# Patient Record
Sex: Female | Born: 1980 | Race: Black or African American | Hispanic: No | Marital: Married | Smoking: Never smoker
Health system: Southern US, Community
[De-identification: ages and names within clinical notes are randomized; demographics above are authoritative.]

## PROBLEM LIST (undated history)

## (undated) ENCOUNTER — Inpatient Hospital Stay (HOSPITAL_COMMUNITY): Payer: Self-pay

## (undated) DIAGNOSIS — IMO0001 Reserved for inherently not codable concepts without codable children: Secondary | ICD-10-CM

## (undated) DIAGNOSIS — Z789 Other specified health status: Secondary | ICD-10-CM

## (undated) HISTORY — PX: NO PAST SURGERIES: SHX2092

## (undated) HISTORY — DX: Gilbert syndrome: E80.4

## (undated) HISTORY — PX: MOUTH SURGERY: SHX715

## (undated) HISTORY — PX: DILATION AND EVACUATION: SHX1459

---
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
## 2000-11-19 ENCOUNTER — Other Ambulatory Visit: Admission: RE | Admit: 2000-11-19 | Discharge: 2000-11-19 | Payer: Self-pay | Admitting: *Deleted

## 2001-04-20 ENCOUNTER — Emergency Department (HOSPITAL_COMMUNITY): Admission: EM | Admit: 2001-04-20 | Discharge: 2001-04-20 | Payer: Self-pay | Admitting: Emergency Medicine

## 2001-04-20 ENCOUNTER — Encounter: Payer: Self-pay | Admitting: Emergency Medicine

## 2001-05-19 ENCOUNTER — Other Ambulatory Visit: Admission: RE | Admit: 2001-05-19 | Discharge: 2001-05-19 | Payer: Self-pay | Admitting: *Deleted

## 2001-10-12 ENCOUNTER — Other Ambulatory Visit: Admission: RE | Admit: 2001-10-12 | Discharge: 2001-10-12 | Payer: Self-pay | Admitting: *Deleted

## 2001-11-22 ENCOUNTER — Emergency Department (HOSPITAL_COMMUNITY): Admission: EM | Admit: 2001-11-22 | Discharge: 2001-11-22 | Payer: Self-pay | Admitting: Emergency Medicine

## 2002-05-04 ENCOUNTER — Other Ambulatory Visit: Admission: RE | Admit: 2002-05-04 | Discharge: 2002-05-04 | Payer: Self-pay | Admitting: *Deleted

## 2003-04-20 ENCOUNTER — Other Ambulatory Visit: Admission: RE | Admit: 2003-04-20 | Discharge: 2003-04-20 | Payer: Self-pay | Admitting: Gynecology

## 2004-05-28 ENCOUNTER — Other Ambulatory Visit: Admission: RE | Admit: 2004-05-28 | Discharge: 2004-05-28 | Payer: Self-pay | Admitting: Gynecology

## 2004-11-10 ENCOUNTER — Emergency Department: Payer: Self-pay | Admitting: Emergency Medicine

## 2004-11-11 ENCOUNTER — Ambulatory Visit: Payer: Self-pay | Admitting: Emergency Medicine

## 2005-06-20 ENCOUNTER — Other Ambulatory Visit: Admission: RE | Admit: 2005-06-20 | Discharge: 2005-06-20 | Payer: Self-pay | Admitting: Gynecology

## 2006-08-07 ENCOUNTER — Other Ambulatory Visit: Admission: RE | Admit: 2006-08-07 | Discharge: 2006-08-07 | Payer: Self-pay | Admitting: Gynecology

## 2007-08-10 ENCOUNTER — Other Ambulatory Visit: Admission: RE | Admit: 2007-08-10 | Discharge: 2007-08-10 | Payer: Self-pay | Admitting: Gynecology

## 2008-02-28 ENCOUNTER — Emergency Department (HOSPITAL_COMMUNITY): Admission: EM | Admit: 2008-02-28 | Discharge: 2008-02-28 | Payer: Self-pay | Admitting: Emergency Medicine

## 2008-08-10 ENCOUNTER — Other Ambulatory Visit: Admission: RE | Admit: 2008-08-10 | Discharge: 2008-08-10 | Payer: Self-pay | Admitting: Gynecology

## 2008-08-10 ENCOUNTER — Ambulatory Visit: Payer: Self-pay | Admitting: Gynecology

## 2008-08-10 ENCOUNTER — Encounter: Payer: Self-pay | Admitting: Gynecology

## 2009-02-06 ENCOUNTER — Other Ambulatory Visit: Admission: RE | Admit: 2009-02-06 | Discharge: 2009-02-06 | Payer: Self-pay | Admitting: Gynecology

## 2009-02-06 ENCOUNTER — Encounter: Payer: Self-pay | Admitting: Gynecology

## 2009-02-06 ENCOUNTER — Ambulatory Visit: Payer: Self-pay | Admitting: Gynecology

## 2009-10-30 ENCOUNTER — Ambulatory Visit: Payer: Self-pay | Admitting: Gynecology

## 2009-10-30 ENCOUNTER — Other Ambulatory Visit: Admission: RE | Admit: 2009-10-30 | Discharge: 2009-10-30 | Payer: Self-pay | Admitting: Gynecology

## 2010-04-28 NOTE — L&D Delivery Note (Signed)
C/C/+2 at 1332 Pushing started 1332  Delivery Note At 1:45 PM a viable female was delivered via Vaginal, Spontaneous Delivery (Presentation: OA to ROT, water birth with CNM attending).  APGAR: 8, 9; weight 5-15.   Placenta status: Intact, Spontaneous.  Cord: 3 vessels with the following complications: None.  Cord pH: not done  Anesthesia: Local  Episiotomy: None Lacerations: Labial;Vaginal Suture Repair: chromic 4.0 Est. Blood Loss (mL): 300  Mom to postpartum.  Baby to mother's room.  Tymothy Cass 03/07/2011, 2:22 PM

## 2010-07-05 ENCOUNTER — Ambulatory Visit (INDEPENDENT_AMBULATORY_CARE_PROVIDER_SITE_OTHER): Payer: Federal, State, Local not specified - PPO | Admitting: Gynecology

## 2010-07-05 DIAGNOSIS — R1031 Right lower quadrant pain: Secondary | ICD-10-CM

## 2010-07-05 DIAGNOSIS — N949 Unspecified condition associated with female genital organs and menstrual cycle: Secondary | ICD-10-CM

## 2010-07-05 DIAGNOSIS — N644 Mastodynia: Secondary | ICD-10-CM

## 2010-07-08 ENCOUNTER — Other Ambulatory Visit: Payer: Federal, State, Local not specified - PPO

## 2010-07-08 ENCOUNTER — Ambulatory Visit (INDEPENDENT_AMBULATORY_CARE_PROVIDER_SITE_OTHER): Payer: Federal, State, Local not specified - PPO | Admitting: Gynecology

## 2010-07-08 DIAGNOSIS — N949 Unspecified condition associated with female genital organs and menstrual cycle: Secondary | ICD-10-CM

## 2010-07-08 DIAGNOSIS — R1031 Right lower quadrant pain: Secondary | ICD-10-CM

## 2010-07-09 ENCOUNTER — Other Ambulatory Visit: Payer: Self-pay | Admitting: Gynecology

## 2010-07-09 ENCOUNTER — Encounter (INDEPENDENT_AMBULATORY_CARE_PROVIDER_SITE_OTHER): Payer: Federal, State, Local not specified - PPO | Admitting: Gynecology

## 2010-07-09 ENCOUNTER — Other Ambulatory Visit (HOSPITAL_COMMUNITY)
Admission: RE | Admit: 2010-07-09 | Discharge: 2010-07-09 | Disposition: A | Discharge status: Home / Self Care | Payer: Federal, State, Local not specified - PPO | Source: Ambulatory Visit | Attending: Gynecology | Admitting: Gynecology

## 2010-07-09 DIAGNOSIS — Z01419 Encounter for gynecological examination (general) (routine) without abnormal findings: Secondary | ICD-10-CM

## 2010-07-09 DIAGNOSIS — B373 Candidiasis of vulva and vagina: Secondary | ICD-10-CM

## 2010-07-09 DIAGNOSIS — Z124 Encounter for screening for malignant neoplasm of cervix: Secondary | ICD-10-CM | POA: Insufficient documentation

## 2010-07-15 ENCOUNTER — Ambulatory Visit: Payer: Federal, State, Local not specified - PPO | Admitting: Gynecology

## 2010-08-19 LAB — ABO/RH

## 2010-08-19 LAB — RUBELLA ANTIBODY, IGM: Rubella: IMMUNE

## 2010-08-19 LAB — HEPATITIS B SURFACE ANTIGEN: Hepatitis B Surface Ag: NEGATIVE

## 2010-08-19 LAB — HIV ANTIBODY (ROUTINE TESTING W REFLEX): HIV: NONREACTIVE

## 2011-01-29 LAB — BASIC METABOLIC PANEL
BUN: 6
CO2: 27
Calcium: 9
Chloride: 107
Creatinine, Ser: 0.68
GFR calc Af Amer: >60
GFR calc non Af Amer: >60
Glucose, Bld: 96
Potassium: 4
Sodium: 139

## 2011-01-29 LAB — URINALYSIS, ROUTINE W REFLEX MICROSCOPIC
Glucose, UA: NEGATIVE
Hgb urine dipstick: NEGATIVE
pH: 7

## 2011-01-29 LAB — POCT PREGNANCY, URINE: Preg Test, Ur: NEGATIVE

## 2011-03-07 ENCOUNTER — Encounter (HOSPITAL_COMMUNITY): Payer: Self-pay | Admitting: *Deleted

## 2011-03-07 ENCOUNTER — Inpatient Hospital Stay (HOSPITAL_COMMUNITY)
Admission: RE | Admit: 2011-03-07 | Discharge: 2011-03-09 | DRG: 373 | Disposition: A | Discharge status: Home / Self Care | Payer: Federal, State, Local not specified - PPO | Source: Ambulatory Visit | Attending: Obstetrics and Gynecology | Admitting: Obstetrics and Gynecology

## 2011-03-07 DIAGNOSIS — Z23 Encounter for immunization: Secondary | ICD-10-CM

## 2011-03-07 DIAGNOSIS — IMO0001 Reserved for inherently not codable concepts without codable children: Secondary | ICD-10-CM

## 2011-03-07 HISTORY — DX: Reserved for inherently not codable concepts without codable children: IMO0001

## 2011-03-07 MED ORDER — IBUPROFEN 600 MG PO TABS
600.0000 mg | ORAL_TABLET | Freq: Four times a day (QID) | ORAL | Status: DC
  Administered 2011-03-07 – 2011-03-09 (×8): 600 mg via ORAL
  Filled 2011-03-07 (×8): qty 1

## 2011-03-07 MED ORDER — ACETAMINOPHEN 325 MG PO TABS: 650.0000 mg | ORAL_TABLET | ORAL | Status: DC | PRN

## 2011-03-07 MED ORDER — OXYTOCIN 10 UNIT/ML IJ SOLN
INTRAMUSCULAR | Status: AC
  Administered 2011-03-07: 10 [IU] via INTRAMUSCULAR
  Filled 2011-03-07: qty 2

## 2011-03-07 MED ORDER — BENZOCAINE-MENTHOL 20-0.5 % EX AERO
1.0000 "application " | INHALATION_SPRAY | CUTANEOUS | Status: DC | PRN
  Administered 2011-03-07: 1 via TOPICAL

## 2011-03-07 MED ORDER — COMPLETENATE 29-1 MG PO CHEW
1.0000 | CHEWABLE_TABLET | Freq: Every day | ORAL | Status: DC
  Administered 2011-03-08 – 2011-03-09 (×2): 1 via ORAL
  Filled 2011-03-07 (×3): qty 1

## 2011-03-07 MED ORDER — FLEET ENEMA 7-19 GM/118ML RE ENEM: 1.0000 | ENEMA | RECTAL | Status: DC | PRN

## 2011-03-07 MED ORDER — DIPHENHYDRAMINE HCL 25 MG PO CAPS: 25.0000 mg | ORAL_CAPSULE | Freq: Four times a day (QID) | ORAL | Status: DC | PRN

## 2011-03-07 MED ORDER — OXYCODONE-ACETAMINOPHEN 5-325 MG PO TABS: 2.0000 | ORAL_TABLET | ORAL | Status: DC | PRN

## 2011-03-07 MED ORDER — TETANUS-DIPHTH-ACELL PERTUSSIS 5-2.5-18.5 LF-MCG/0.5 IM SUSP: 0.5000 mL | Freq: Once | INTRAMUSCULAR | Status: DC

## 2011-03-07 MED ORDER — ONDANSETRON HCL 4 MG/2ML IJ SOLN: 4.0000 mg | Freq: Four times a day (QID) | INTRAMUSCULAR | Status: DC | PRN

## 2011-03-07 MED ORDER — ONDANSETRON HCL 4 MG/2ML IJ SOLN: 4.0000 mg | INTRAMUSCULAR | Status: DC | PRN

## 2011-03-07 MED ORDER — LANOLIN HYDROUS EX OINT: TOPICAL_OINTMENT | CUTANEOUS | Status: DC | PRN

## 2011-03-07 MED ORDER — LIDOCAINE HCL (PF) 1 % IJ SOLN
30.0000 mL | INTRAMUSCULAR | Status: DC | PRN
  Filled 2011-03-07: qty 30

## 2011-03-07 MED ORDER — DIBUCAINE 1 % RE OINT: 1.0000 "application " | TOPICAL_OINTMENT | RECTAL | Status: DC | PRN

## 2011-03-07 MED ORDER — ZOLPIDEM TARTRATE 5 MG PO TABS: 5.0000 mg | ORAL_TABLET | Freq: Every evening | ORAL | Status: DC | PRN

## 2011-03-07 MED ORDER — SENNOSIDES-DOCUSATE SODIUM 8.6-50 MG PO TABS
2.0000 | ORAL_TABLET | Freq: Every day | ORAL | Status: DC
  Administered 2011-03-09: 2 via ORAL

## 2011-03-07 MED ORDER — BISACODYL 10 MG RE SUPP: 10.0000 mg | Freq: Every day | RECTAL | Status: DC | PRN

## 2011-03-07 MED ORDER — CITRIC ACID-SODIUM CITRATE 334-500 MG/5ML PO SOLN: 30.0000 mL | ORAL | Status: DC | PRN

## 2011-03-07 MED ORDER — PRENATAL PLUS 27-1 MG PO TABS
1.0000 | ORAL_TABLET | Freq: Every day | ORAL | Status: DC
  Filled 2011-03-07: qty 1

## 2011-03-07 MED ORDER — WITCH HAZEL-GLYCERIN EX PADS: 1.0000 "application " | MEDICATED_PAD | CUTANEOUS | Status: DC | PRN

## 2011-03-07 MED ORDER — IBUPROFEN 600 MG PO TABS: 600.0000 mg | ORAL_TABLET | Freq: Four times a day (QID) | ORAL | Status: DC | PRN

## 2011-03-07 MED ORDER — OXYCODONE-ACETAMINOPHEN 5-325 MG PO TABS: 1.0000 | ORAL_TABLET | ORAL | Status: DC | PRN

## 2011-03-07 MED ORDER — BENZOCAINE-MENTHOL 20-0.5 % EX AERO
INHALATION_SPRAY | CUTANEOUS | Status: AC
  Administered 2011-03-07: 1 via TOPICAL
  Filled 2011-03-07: qty 56

## 2011-03-07 MED ORDER — ONDANSETRON HCL 4 MG PO TABS: 4.0000 mg | ORAL_TABLET | ORAL | Status: DC | PRN

## 2011-03-07 MED ORDER — SIMETHICONE 80 MG PO CHEW: 80.0000 mg | CHEWABLE_TABLET | ORAL | Status: DC | PRN

## 2011-03-07 NOTE — H&P (Signed)
Nina Rodriguez is a 30 y.o. G1P0 at [redacted]w[redacted]d presenting for active labor. Seen in office this am,  was 5/c/-1 and BBOW. Parallel care w/ CNM/OB for water birth. Now laboring in water tub w/ support person at side. Increased rectal pressure and back labor since AROM w/ clear AF @ 1230.  Prenatal Course Source of Care:WOB  with onset of care at 9 weeks Pregnancy complications or risk: nl antepartum, Hgb C trait positive, FOB tested and neg. Current medications: PNV, Tums, Tylenol PM  OB History    Grav Para Term Preterm Abortions TAB SAB Ect Mult Living   1              Past Medical History  Diagnosis Date  . Gilbert syndrome   . Active labor (plan water labor/birth) 03/07/2011   Past Surgical History  Procedure Date  . Mouth surgery     age 25   Family History: family history includes Diabetes in her paternal grandfather, paternal grandmother, paternal uncle, and sister and Hypertension in her father, maternal grandfather, maternal grandmother, mother, paternal grandfather, paternal grandmother, and sister. Social History:  has an unknown smoking status. She does not have any smokeless tobacco history on file. She reports that she does not drink alcohol or use illicit drugs.  Review of Systems - Negative except as noted   Dilation: 7 Effacement (%): 90 Station: -1 Exam by:: paul Blood pressure 142/95, pulse 78, temperature 98.5 F (36.9 C), resp. rate 20, height 5\' 6"  (1.676 m), weight 99.791 kg (220 lb), last menstrual period 06/17/2010.  Physical Exam: Blood pressure 142/95, pulse 78, temperature 98.5 F (36.9 C), resp. rate 20, height 5\' 6"  (1.676 m), weight 99.791 kg (220 lb), last menstrual period 06/17/2010. General: NAD Heart: RRR, no murmurs Lungs: CTA b/l  Abd: Soft, NT, EFW 7lb  Ext: no edema Neuro: DTRs normal Other: no PEC s/s   Membranes: AROM w/ cl AF, vtx Vaginal bleeding: (+) show / small FHR:  Baseline rate 140   Variability  moderate  Accelerations  present  Decelerations early Contractions: Frequency 2-4  Duration 60 sec  Intensity mod - strong  Pertinent Labs/Studies:  CBC, RPR pending  Prenatal labs: ABO, Rh: A/Positive/-- (04/23 0000) Antibody:  neg Rubella:  immune RPR: Nonreactive (04/23 0000)  HBsAg: Negative (04/23 0000)  HIV: Non-reactive (04/23 0000)  GBS:   neg 1 hr Glucola nl  Genetic screening nl Ultrasound: Weeks 18 Result nl fetal anatomy  Assessment: 30 y.o. G1P0 at [redacted]w[redacted]d in spontaneous labor admitted to birthing suites  1. Labor: progressing well with back labor 2. Fetal Wellbeing: Category 1 on intermittent EFM  3. Pain Control: hydrotherapy, labor support 4. GBS: neg 5. Labile BP in labor  Plan:  1. Monitor progress closely 2. Labor support, Nubain IM PRN for pain control 3. Anticipate SVD - water birth 4. Monitor for [facility]   Consultant: Dr. Alesia Banda 03/07/2011, 1:01 PM

## 2011-03-07 NOTE — Progress Notes (Signed)
Asked to assess patient and update CNM on-call with status  S: Feeling well - pressure with ctx      Tolerating contractions well - breathing with ctx / calm / no urge to push        Laboring in tub with support person at tub-side   O:  VS: Blood pressure 145/103, pulse 77, temperature 98.1 F (36.7 C), resp. rate 20, height 5\' 6"  (1.676 m), weight 99.791 kg (220 lb).        Assisted out of tub - to bathroom to void / cervical exam        FHR : 140 intermittent check / CTX every 3-4 minutes             Cervix : 7-8 / 90% / vtx -1 / BBOW        Membranes: intact  A: Active labor  P: Hydrotherapy     Update to Arlan Organ CNM (on-call)     Labor support by midwife and spouse - CNM on-call available in next 20-30 minutes     Anticipate AROM at next cervical exam        Jermy Couper 03/07/2011, 11:40 AM

## 2011-03-07 NOTE — Progress Notes (Signed)
  S: Doing well, pain managed w/ hydrotherapy in tub, + back pain,   pelvic pressure and + urge to push   O: BP 136/84  Pulse 78  Temp 98.5 F (36.9 C)  Resp 20  Ht 5\' 6"  (1.676 m)  Wt 99.791 kg (220 lb)  BMI 35.51 kg/m2  LMP 06/17/2010   FHT:  FHR: 135 bpm UC:   irregular, every 2-4 minutes, coupling SVE:   Dilation: 9 Effacement (%): 80 Station: -1 Exam by:: paul cnm   A / P: Spontaneous labor, progressing normally  Fetal Wellbeing:  Category I Pain Control:  Labor support without medications  Anticipated MOD:  NSVD  PAUL,DANIELA 03/07/2011, 1:28 PM

## 2011-03-08 ENCOUNTER — Encounter (HOSPITAL_COMMUNITY): Payer: Self-pay

## 2011-03-08 LAB — CBC
Hemoglobin: 11.6 g/dL — ABNORMAL LOW (ref 12.0–15.0)
MCH: 30.4 pg (ref 26.0–34.0)
Platelets: 198 10*3/uL (ref 150–400)
RBC: 3.81 MIL/uL — ABNORMAL LOW (ref 3.87–5.11)
WBC: 13.6 10*3/uL — ABNORMAL HIGH (ref 4.0–10.5)

## 2011-03-08 LAB — RPR: RPR Ser Ql: NONREACTIVE

## 2011-03-08 NOTE — Progress Notes (Signed)
  PPD 1 SVD  S:  Reports feeling tired but well.             Tolerating po/ No nausea or vomiting             Bleeding is light             Pain controlled withprescription NSAID's including motrin             Up ad lib / ambulatory  Newborn  Information for the patient's newborn:  Nina Rodriguez, Nina Rodriguez [[id number]]  female  breast feeding, working on latch  / Circumcision planned, will do tomorrow - improve latch today   O:  A & O x 3 NAD             VS: Blood pressure 107/68, pulse 79, temperature 97.8 F (36.6 C), temperature source Oral, resp. rate 19, height 5\' 6"  (1.676 m), weight 99.791 kg (220 lb), last menstrual period 06/17/2010, unknown if currently breastfeeding.  LABS:  Basename 03/08/11 0541  HGB 11.6*  HCT 32.2*    I&O: I/O last 3 completed shifts: In: -  Out: 300 [Blood:300]      Lungs: Clear and unlabored  Heart: regular rate and rhythm / no mumurs  Abdomen: soft, non-tender, non-distended             Fundus: firm, non-tender, @U   Perineum:  Repair intact, minimal edema  Lochia: small  Extremities: trace edema, no calf pain or tenderness, neg Homans    A/P: PPD # 1 30 y.o., G1P1001 S/P: SVD water birth   Principal Problem:  *Postpartum care following vaginal delivery (water birth 11/9/)    Doing well - stable status  Routine post partum orders  Considering flu shot  Ojas Coone, CNM, MSN 03/08/2011, 8:59 AM

## 2011-03-09 MED ORDER — IBUPROFEN 600 MG PO TABS: 600.0000 mg | ORAL_TABLET | Freq: Four times a day (QID) | ORAL | Status: AC

## 2011-03-09 NOTE — Progress Notes (Signed)
Post Partum Day #2            Information for the patient's newborn:  Lyrical, Sowle Sheronica [[id number]]  female   / circumcision pending today Feeding: breast  Subjective: No HA, SOB, CP, F/C, breast symptoms. Pain minimal. Normal vaginal bleeding, no clots.      Objective:  Temp:  [98 F (36.7 C)-98.5 F (36.9 C)] 98 F (36.7 C) (11/10 2124) Pulse Rate:  [68-72] 72  (11/10 2124) Resp:  [18] 18  (11/10 2124) BP: (103-110)/(70-73) 103/70 mmHg (11/10 2124)  No intake or output data in the 24 hours ending 03/09/11 0827     Basename 03/08/11 0541  WBC 13.6*  HGB 11.6*  HCT 32.2*  PLT 198    Blood type: A/Positive/-- (04/23 0000) Rubella: Immune (04/23 0000)    Physical Exam:  General: alert, cooperative and no distress Uterine Fundus: firm Lochia: appropriate Perineum: repair intact, edema none DVT Evaluation: Negative Homan's sign. No significant calf/ankle edema.    Assessment/Plan: PPD # 2 / 30 y.o., G1P1001 S/P:spontaneous vaginal   Principal Problem:  *Postpartum care following vaginal delivery (water birth 11/9/)    normal postpartum exam  Continue current postpartum care  D/C home   LOS: 2 days   Eleftheria Taborn, CNM, MSN 03/09/2011, 8:27 AM

## 2011-03-09 NOTE — Discharge Summary (Signed)
Obstetric Discharge Summary Reason for Admission: onset of labor Prenatal Procedures: ultrasound Intrapartum Procedures: spontaneous vaginal delivery and water birth Postpartum Procedures: Tdap vaccine Complications-Operative and Postpartum: bilateral labial laceration Hemoglobin  Date Value Range Status  03/08/2011 11.6* 12.0-15.0 (g/dL) Final     HCT  Date Value Range Status  03/08/2011 32.2* 36.0-46.0 (%) Final    Discharge Diagnoses: Term Pregnancy-delivered  Discharge Information: Date: 03/09/2011 Activity: pelvic rest Diet: routine Medications: PNV and Ibuprofen Condition: stable Instructions: refer to practice specific booklet Discharge to: home Follow-up Information    Follow up with [name],[name], MD in 6 weeks.   Contact information:   [address] [place] [address] [phone]          Newborn Data: Live born female "Sheria Lang" Birth Weight: 5 lb 15.9 oz (2719 g) APGAR: 8, 9  Home with mother.  PAUL,DANIELA 03/09/2011, 8:32 AM

## 2011-04-29 ENCOUNTER — Encounter (HOSPITAL_COMMUNITY): Payer: Self-pay | Admitting: *Deleted

## 2011-04-29 ENCOUNTER — Emergency Department (HOSPITAL_COMMUNITY)
Admission: EM | Admit: 2011-04-29 | Discharge: 2011-04-29 | Disposition: A | Discharge status: Home / Self Care | Payer: Federal, State, Local not specified - PPO | Source: Home / Self Care | Attending: Emergency Medicine | Admitting: Emergency Medicine

## 2011-04-29 DIAGNOSIS — K529 Noninfective gastroenteritis and colitis, unspecified: Secondary | ICD-10-CM

## 2011-04-29 DIAGNOSIS — K5289 Other specified noninfective gastroenteritis and colitis: Secondary | ICD-10-CM

## 2011-04-29 HISTORY — DX: Reserved for inherently not codable concepts without codable children: IMO0001

## 2011-04-29 LAB — POCT URINALYSIS DIP (DEVICE)
Glucose, UA: NEGATIVE mg/dL
Leukocytes, UA: NEGATIVE
Nitrite: NEGATIVE
pH: 6 (ref 5.0–8.0)

## 2011-04-29 MED ORDER — BELLADONNA ALK-PHENOBARBITAL 16.2 MG/5ML PO ELIX: 10.0000 mL | ORAL_SOLUTION | Freq: Four times a day (QID) | ORAL | Status: DC | PRN

## 2011-04-29 MED ORDER — KETOROLAC TROMETHAMINE 60 MG/2ML IM SOLN
INTRAMUSCULAR | Status: AC
  Filled 2011-04-29: qty 2

## 2011-04-29 MED ORDER — TRAMADOL HCL 50 MG PO TABS: 100.0000 mg | ORAL_TABLET | Freq: Three times a day (TID) | ORAL | Status: AC | PRN

## 2011-04-29 MED ORDER — GI COCKTAIL ~~LOC~~
ORAL | Status: AC
  Filled 2011-04-29: qty 30

## 2011-04-29 MED ORDER — GI COCKTAIL ~~LOC~~
30.0000 mL | Freq: Once | ORAL | Status: AC
  Administered 2011-04-29: 30 mL via ORAL

## 2011-04-29 MED ORDER — KETOROLAC TROMETHAMINE 60 MG/2ML IM SOLN
60.0000 mg | Freq: Once | INTRAMUSCULAR | Status: AC
  Administered 2011-04-29: 60 mg via INTRAMUSCULAR

## 2011-04-29 NOTE — ED Provider Notes (Signed)
History     CSN: [id number]  Arrival date & time 04/29/11  4098   First MD Initiated Contact with Patient 04/29/11 0848      Chief Complaint  Patient presents with  . Headache  . Abdominal Pain  . Nausea    (Consider location/radiation/quality/duration/timing/severity/associated sxs/prior treatment) HPI Comments: Nina Rodriguez has had a history since yesterday of headache, chills, aching all over, aching in her chest and back, diffuse abdominal pain, and nausea. She denies any fever, sweats, nasal congestion, sore throat, cough, vomiting, or diarrhea. She has had no urinary symptoms or GYN complaints.  Patient is a 31 y.o. female presenting with headaches and abdominal pain.  Headache The primary symptoms include headaches and nausea. Primary symptoms do not include fever or vomiting.  Abdominal Pain The primary symptoms of the illness include abdominal pain, fatigue and nausea. The primary symptoms of the illness do not include fever, shortness of breath, vomiting, diarrhea or dysuria.  Additional symptoms associated with the illness include chills. Symptoms associated with the illness do not include constipation, urgency or frequency.    Past Medical History  Diagnosis Date  . Gilbert syndrome   . Active labor (plan water labor/birth) 03/07/2011  . Postpartum care following vaginal delivery (water birth 11/9/) 03/07/2011  . Normal breast feeding     Past Surgical History  Procedure Date  . Mouth surgery     age 35    Family History  Problem Relation Age of Onset  . Hypertension Mother   . Hypertension Father   . Diabetes Sister   . Hypertension Sister   . Diabetes Paternal Uncle   . Hypertension Maternal Grandmother   . Hypertension Maternal Grandfather   . Diabetes Paternal Grandmother   . Hypertension Paternal Grandmother   . Diabetes Paternal Grandfather   . Hypertension Paternal Grandfather     History  Substance Use Topics  . Smoking status: Never Smoker   .  Smokeless tobacco: Never Used  . Alcohol Use: No    OB History    Grav Para Term Preterm Abortions TAB SAB Ect Mult Living   1 1 1       1       Review of Systems  Constitutional: Positive for chills and fatigue. Negative for fever, appetite change and unexpected weight change.  Respiratory: Negative for cough, shortness of breath and wheezing.   Cardiovascular: Negative for chest pain.  Gastrointestinal: Positive for nausea and abdominal pain. Negative for vomiting, diarrhea, constipation, blood in stool, abdominal distention, anal bleeding and rectal pain.  Genitourinary: Negative for dysuria, urgency and frequency.  Musculoskeletal: Positive for myalgias.  Skin: Negative for rash.  Neurological: Positive for headaches.    Allergies  Review of patient's allergies indicates no known allergies.  Home Medications   Current Outpatient Rx  Name Route Sig Dispense Refill  . BELLADONNA ALK-PHENOBARBITAL 16.2 MG/5ML PO ELIX Oral Take 10 mLs (32.4 mg total) by mouth 4 (four) times daily as needed for cramping. 120 mL 0  . COMPLETENATE 29-1 MG PO CHEW Oral Chew 1 tablet by mouth daily.      . TRAMADOL HCL 50 MG PO TABS Oral Take 2 tablets (100 mg total) by mouth every 8 (eight) hours as needed for pain. Maximum dose= 8 tablets per day 30 tablet 0    BP 133/91  Pulse 82  Temp(Src) 99.8 F (37.7 C) (Oral)  Resp 16  SpO2 98%  LMP 04/17/2011  Breastfeeding? Yes  Physical Exam  Nursing note and  vitals reviewed. Constitutional: She appears well-developed and well-nourished. No distress.  Eyes: No scleral icterus.  Cardiovascular: Normal rate, regular rhythm and normal heart sounds.  Exam reveals no gallop and no friction rub.   No murmur heard. Pulmonary/Chest: Effort normal and breath sounds normal. No respiratory distress. She has no wheezes. She has no rales.  Abdominal: Soft. Bowel sounds are normal. She exhibits no distension and no mass. There is no hepatosplenomegaly. There  is tenderness (she has mild generalized tenderness to palpation without guarding or rebound). There is no rebound, no guarding and no CVA tenderness.  Skin: Skin is warm and dry. No rash noted. She is not diaphoretic.    ED Course  Procedures (including critical care time)  Results for orders placed during the hospital encounter of 04/29/11  POCT URINALYSIS DIP (DEVICE)      Component Value Range   Glucose, UA NEGATIVE  NEGATIVE (mg/dL)   Bilirubin Urine SMALL (*) NEGATIVE    Ketones, ur TRACE (*) NEGATIVE (mg/dL)   Specific Gravity, Urine 1.020  1.005 - 1.030    Hgb urine dipstick NEGATIVE  NEGATIVE    pH 6.0  5.0 - 8.0    Protein, ur NEGATIVE  NEGATIVE (mg/dL)   Urobilinogen, UA 1.0  0.0 - 1.0 (mg/dL)   Nitrite NEGATIVE  NEGATIVE    Leukocytes, UA NEGATIVE  NEGATIVE      Labs Reviewed  POCT URINALYSIS DIP (DEVICE) - Abnormal; Notable for the following:    Bilirubin Urine SMALL (*)    Ketones, ur TRACE (*)    All other components within normal limits   She was given call 60 mg IM and 30 and else a GI cocktail. She felt better after this and desired to go home. She is breast-feeding and knows to discard her breast milk today and also all subsequent breast milk when she's taking any medications.   No results found.   1. Gastroenteritis       MDM  She appears to have a viral syndrome, probably viral gastroenteritis. She was sent home with tramadol for the headache and Donnatal liquid for the abdominal pain. Suggested she return again if she's not better in 2 or 3 days or if he gets worse in any way.        Roque Lias, MD 04/29/11 [phone]

## 2011-04-29 NOTE — ED Notes (Signed)
Pt is here with complaints of 2 day history of severe HA, stomach pain with nausea and generalized body aches.  Denies cough or fever.  Pt is 7 weeks post partum and is breast feeding.

## 2012-04-28 NOTE — L&D Delivery Note (Signed)
Delivery Note  First Stage: Labor onset: 1430 Augmentation : AROM Analgesia /Anesthesia intrapartum: Nubain 10 mg IVP x 1 dose AROM at 1024  Second Stage: Complete dilation at 1736 Onset of pushing at 1736 FHR second stage 120  Delivery of a viable female at 75 by CNM in LOA position No nuchal cord Cord double clamped after cessation of pulsation, cut by sister Cord blood sample collected   Third Stage: Placenta delivered Nina Rodriguez intact with 3 VC @ 1749 Placenta disposition: L&D Uterine tone firm / bleeding minimal  No laceration identified  Repair: none Est. Blood Loss (mL): 100  Complications: none  Mom to postpartum.  Baby to Couplet care / Skin to Skin.  Newborn: Birth Weight: 7lbs 7.4oz  Apgar Scores: 8/9 Feeding planned: breast  *Dr. Juliene Pina notified of uncomplicated vaginal delivery and plans for early AM discharge - ok for discharge, if no complications and approval from ped  Raelyn Mora, M  MSN, CNM 03/31/2013, 5:58 PM

## 2012-08-27 LAB — OB RESULTS CONSOLE RPR: RPR: NONREACTIVE

## 2012-08-27 LAB — OB RESULTS CONSOLE HIV ANTIBODY (ROUTINE TESTING): HIV: NONREACTIVE

## 2013-02-02 ENCOUNTER — Encounter (HOSPITAL_COMMUNITY): Payer: Self-pay | Admitting: *Deleted

## 2013-02-02 ENCOUNTER — Inpatient Hospital Stay (HOSPITAL_COMMUNITY)
Admission: AD | Admit: 2013-02-02 | Discharge: 2013-02-02 | Disposition: A | Discharge status: Home / Self Care | Payer: No Typology Code available for payment source | Source: Ambulatory Visit | Attending: Obstetrics and Gynecology | Admitting: Obstetrics and Gynecology

## 2013-02-02 DIAGNOSIS — E860 Dehydration: Secondary | ICD-10-CM | POA: Insufficient documentation

## 2013-02-02 DIAGNOSIS — O4700 False labor before 37 completed weeks of gestation, unspecified trimester: Secondary | ICD-10-CM | POA: Insufficient documentation

## 2013-02-02 DIAGNOSIS — O479 False labor, unspecified: Secondary | ICD-10-CM

## 2013-02-02 LAB — URINALYSIS, ROUTINE W REFLEX MICROSCOPIC
Bilirubin Urine: NEGATIVE
Leukocytes, UA: NEGATIVE
Nitrite: NEGATIVE
Specific Gravity, Urine: 1.015 (ref 1.005–1.030)
Urobilinogen, UA: 1 mg/dL (ref 0.0–1.0)

## 2013-02-02 MED ORDER — NIFEDIPINE 10 MG PO CAPS
10.0000 mg | ORAL_CAPSULE | Freq: Once | ORAL | Status: AC
  Administered 2013-02-02: 10 mg via ORAL
  Filled 2013-02-02: qty 1

## 2013-02-02 MED ORDER — NIFEDIPINE 10 MG PO CAPS: 10.0000 mg | ORAL_CAPSULE | Freq: Four times a day (QID) | ORAL | Status: DC

## 2013-02-02 MED ORDER — DEXTROSE 5 % IN LACTATED RINGERS IV BOLUS
1000.0000 mL | Freq: Once | INTRAVENOUS | Status: AC
Start: 2013-02-02 — End: 2013-02-02
  Administered 2013-02-02: 1000 mL via INTRAVENOUS

## 2013-02-02 MED ORDER — NITROFURANTOIN MONOHYD MACRO 100 MG PO CAPS: 100.0000 mg | ORAL_CAPSULE | Freq: Two times a day (BID) | ORAL | Status: DC

## 2013-02-02 NOTE — MAU Provider Note (Signed)
History     Chief Complaint  Patient presents with  . Contractions  32 yo G3P1011 MBF @ [redacted] weeks gestation presents for further evaluation due to [facility] after presenting to the office and was found to have ctx q 3-4 mins on monitor. Cervix was long/closed   OB History   Grav Para Term Preterm Abortions TAB SAB Ect Mult Living   3 1 1  1  1   1       Past Medical History  Diagnosis Date  . Gilbert syndrome   . Active labor (plan water labor/birth) 03/07/2011  . Postpartum care following vaginal delivery (water birth 11/9/) 03/07/2011  . Normal breast feeding     Past Surgical History  Procedure Laterality Date  . Mouth surgery      age 73    Family History  Problem Relation Age of Onset  . Hypertension Mother   . Hypertension Father   . Diabetes Sister   . Hypertension Sister   . Diabetes Paternal Uncle   . Hypertension Maternal Grandmother   . Hypertension Maternal Grandfather   . Diabetes Paternal Grandmother   . Hypertension Paternal Grandmother   . Diabetes Paternal Grandfather   . Hypertension Paternal Grandfather     History  Substance Use Topics  . Smoking status: Never Smoker   . Smokeless tobacco: Never Used  . Alcohol Use: No    Allergies: No Known Allergies  Prescriptions prior to admission  Medication Sig Dispense Refill  . Prenatal Vit-Min-FA-Fish Oil ([employer] PRENATAL GUMMY PO) Take 2 tablets by mouth daily.         Physical Exam   Blood pressure 121/70, pulse 81, temperature 98.1 F (36.7 C), temperature source Oral, resp. rate 18, height 6' 6.5" (1.994 m), weight 92.262 kg (203 lb 6.4 oz), SpO2 100.00%, currently breastfeeding.  No exam performed today, done in office: cervix long/closed/-3.  Tracing: bseline 140  reactive ctx resolved ED Course  PMC 2nd to dehydration IUP @ 32 weeks  P) IVF( 1l) w/ resolution of sx. nifedipine 10 mg po x 1 given. ucx sent PTL prec.  nifedifine q 10 mg q 6 hr prn ctx. macrobid 100 mg po bid x 7days pending  ucx Keep sched OB appt MDM   Marni Franzoni A, MD 5:51 PM 02/02/2013

## 2013-02-02 NOTE — MAU Note (Signed)
Sent from office for eval of PTL.  On monitor at office, was contracting.

## 2013-02-03 ENCOUNTER — Telehealth (HOSPITAL_COMMUNITY): Payer: Self-pay | Admitting: *Deleted

## 2013-03-02 LAB — OB RESULTS CONSOLE GBS: GBS: NEGATIVE

## 2013-03-31 ENCOUNTER — Inpatient Hospital Stay (HOSPITAL_COMMUNITY): Payer: No Typology Code available for payment source | Admitting: Anesthesiology

## 2013-03-31 ENCOUNTER — Inpatient Hospital Stay (HOSPITAL_COMMUNITY)
Admission: AD | Admit: 2013-03-31 | Discharge: 2013-04-02 | DRG: 775 | Disposition: A | Discharge status: Home / Self Care | Payer: No Typology Code available for payment source | Source: Ambulatory Visit | Attending: Obstetrics and Gynecology | Admitting: Obstetrics and Gynecology

## 2013-03-31 ENCOUNTER — Encounter (HOSPITAL_COMMUNITY): Payer: No Typology Code available for payment source | Admitting: Anesthesiology

## 2013-03-31 ENCOUNTER — Encounter (HOSPITAL_COMMUNITY): Payer: Self-pay | Admitting: Obstetrics and Gynecology

## 2013-03-31 HISTORY — DX: Other specified health status: Z78.9

## 2013-03-31 LAB — CBC
MCH: 29.7 pg (ref 26.0–34.0)
MCHC: 36.1 g/dL — ABNORMAL HIGH (ref 30.0–36.0)
Platelets: 207 10*3/uL (ref 150–400)
RBC: 3.87 MIL/uL (ref 3.87–5.11)

## 2013-03-31 LAB — RPR: RPR Ser Ql: NONREACTIVE

## 2013-03-31 MED ORDER — EPHEDRINE 5 MG/ML INJ
10.0000 mg | INTRAVENOUS | Status: DC | PRN
  Filled 2013-03-31: qty 2
  Filled 2013-03-31: qty 4

## 2013-03-31 MED ORDER — PHENYLEPHRINE 40 MCG/ML (10ML) SYRINGE FOR IV PUSH (FOR BLOOD PRESSURE SUPPORT)
80.0000 ug | PREFILLED_SYRINGE | INTRAVENOUS | Status: DC | PRN
  Filled 2013-03-31: qty 10
  Filled 2013-03-31: qty 2

## 2013-03-31 MED ORDER — DIPHENHYDRAMINE HCL 50 MG/ML IJ SOLN: 12.5000 mg | INTRAMUSCULAR | Status: DC | PRN

## 2013-03-31 MED ORDER — BENZOCAINE-MENTHOL 20-0.5 % EX AERO: 1.0000 "application " | INHALATION_SPRAY | CUTANEOUS | Status: DC | PRN

## 2013-03-31 MED ORDER — CITRIC ACID-SODIUM CITRATE 334-500 MG/5ML PO SOLN: 30.0000 mL | ORAL | Status: DC | PRN

## 2013-03-31 MED ORDER — ONDANSETRON HCL 4 MG PO TABS: 4.0000 mg | ORAL_TABLET | ORAL | Status: DC | PRN

## 2013-03-31 MED ORDER — TETANUS-DIPHTH-ACELL PERTUSSIS 5-2.5-18.5 LF-MCG/0.5 IM SUSP: 0.5000 mL | Freq: Once | INTRAMUSCULAR | Status: DC

## 2013-03-31 MED ORDER — OXYTOCIN 10 UNIT/ML IJ SOLN
10.0000 [IU] | Freq: Once | INTRAMUSCULAR | Status: DC | PRN
  Filled 2013-03-31: qty 1

## 2013-03-31 MED ORDER — FENTANYL 2.5 MCG/ML BUPIVACAINE 1/10 % EPIDURAL INFUSION (WH - ANES)
14.0000 mL/h | INTRAMUSCULAR | Status: DC | PRN
  Filled 2013-03-31: qty 125

## 2013-03-31 MED ORDER — OXYTOCIN 40 UNITS IN LACTATED RINGERS INFUSION - SIMPLE MED
1.0000 m[IU]/min | INTRAVENOUS | Status: DC
  Filled 2013-03-31: qty 1000

## 2013-03-31 MED ORDER — WITCH HAZEL-GLYCERIN EX PADS: 1.0000 "application " | MEDICATED_PAD | CUTANEOUS | Status: DC | PRN

## 2013-03-31 MED ORDER — NALBUPHINE SYRINGE 5 MG/0.5 ML
INJECTION | INTRAMUSCULAR | Status: AC
  Administered 2013-03-31: 10 mg via INTRAVENOUS
  Filled 2013-03-31: qty 1

## 2013-03-31 MED ORDER — IBUPROFEN 600 MG PO TABS
600.0000 mg | ORAL_TABLET | Freq: Four times a day (QID) | ORAL | Status: DC
  Administered 2013-03-31 – 2013-04-02 (×6): 600 mg via ORAL
  Filled 2013-03-31 (×6): qty 1

## 2013-03-31 MED ORDER — PHENYLEPHRINE 40 MCG/ML (10ML) SYRINGE FOR IV PUSH (FOR BLOOD PRESSURE SUPPORT)
80.0000 ug | PREFILLED_SYRINGE | INTRAVENOUS | Status: DC | PRN
  Filled 2013-03-31: qty 2

## 2013-03-31 MED ORDER — PRENATAL MULTIVITAMIN CH
1.0000 | ORAL_TABLET | Freq: Every day | ORAL | Status: DC
  Administered 2013-04-01: 1 via ORAL
  Filled 2013-03-31: qty 1

## 2013-03-31 MED ORDER — SENNOSIDES-DOCUSATE SODIUM 8.6-50 MG PO TABS
2.0000 | ORAL_TABLET | ORAL | Status: DC
  Administered 2013-03-31 – 2013-04-02 (×2): 2 via ORAL
  Filled 2013-03-31 (×2): qty 2

## 2013-03-31 MED ORDER — LIDOCAINE HCL (PF) 1 % IJ SOLN
30.0000 mL | INTRAMUSCULAR | Status: DC | PRN
  Filled 2013-03-31: qty 30

## 2013-03-31 MED ORDER — ACETAMINOPHEN 325 MG PO TABS: 650.0000 mg | ORAL_TABLET | ORAL | Status: DC | PRN

## 2013-03-31 MED ORDER — IBUPROFEN 600 MG PO TABS: 600.0000 mg | ORAL_TABLET | Freq: Four times a day (QID) | ORAL | Status: DC | PRN

## 2013-03-31 MED ORDER — DIBUCAINE 1 % RE OINT: 1.0000 "application " | TOPICAL_OINTMENT | RECTAL | Status: DC | PRN

## 2013-03-31 MED ORDER — OXYCODONE-ACETAMINOPHEN 5-325 MG PO TABS: 1.0000 | ORAL_TABLET | ORAL | Status: DC | PRN

## 2013-03-31 MED ORDER — TERBUTALINE SULFATE 1 MG/ML IJ SOLN: 0.2500 mg | Freq: Once | INTRAMUSCULAR | Status: DC | PRN

## 2013-03-31 MED ORDER — SIMETHICONE 80 MG PO CHEW: 80.0000 mg | CHEWABLE_TABLET | ORAL | Status: DC | PRN

## 2013-03-31 MED ORDER — DIPHENHYDRAMINE HCL 25 MG PO CAPS: 25.0000 mg | ORAL_CAPSULE | Freq: Four times a day (QID) | ORAL | Status: DC | PRN

## 2013-03-31 MED ORDER — LACTATED RINGERS IV SOLN: 500.0000 mL | INTRAVENOUS | Status: DC | PRN

## 2013-03-31 MED ORDER — OXYTOCIN 40 UNITS IN LACTATED RINGERS INFUSION - SIMPLE MED
999.0000 mL/h | INTRAVENOUS | Status: DC
  Administered 2013-03-31: 999 mL/h via INTRAVENOUS

## 2013-03-31 MED ORDER — ONDANSETRON HCL 4 MG/2ML IJ SOLN: 4.0000 mg | INTRAMUSCULAR | Status: DC | PRN

## 2013-03-31 MED ORDER — EPHEDRINE 5 MG/ML INJ
10.0000 mg | INTRAVENOUS | Status: DC | PRN
  Filled 2013-03-31: qty 2

## 2013-03-31 MED ORDER — LACTATED RINGERS IV SOLN
500.0000 mL | Freq: Once | INTRAVENOUS | Status: AC
  Administered 2013-03-31: 500 mL via INTRAVENOUS

## 2013-03-31 MED ORDER — NALBUPHINE SYRINGE 5 MG/0.5 ML
10.0000 mg | INJECTION | Freq: Once | INTRAMUSCULAR | Status: AC
  Administered 2013-03-31: 10 mg via INTRAVENOUS

## 2013-03-31 MED ORDER — OXYTOCIN 10 UNIT/ML IJ SOLN: 40.0000 [IU] | Freq: Once | INTRAVENOUS | Status: DC

## 2013-03-31 NOTE — Progress Notes (Signed)
Patient ID: Nina Rodriguez, female   DOB: [birth year], 32 y.o.   MRN: [id number] S: Doing well.  Ambulating in hallway. Pain tolerable with breathing techniques. Pelvic pressure with contractions   O: Filed Vitals:   03/31/13 0943 03/31/13 1118 03/31/13 1159 03/31/13 1203  BP:  119/69  121/71  Pulse:  68 125 72  Temp:  98.2 F (36.8 C)    TempSrc:      Resp:  12  14  Height: 5\' 6"  (1.676 m)     Weight: 95.709 kg (211 lb)     SpO2:   88%      FHT:  FHR: 130 bpm, variability: moderate,  accelerations: 10x10 Present,  decelerations:  Present early; variables UC:   regular, every 2-3 minutes SVE:   Dilation: 5 Effacement (%): 90 Station: -1 Exam by:: Carloyn Jaeger, CNM   A / P: Spontaneous labor AROM-clear fluid  Fetal Wellbeing:  Category I Pain Control:  Labor support without medications-planning hydrotherapy  Anticipated MOD:  NSVD-planning waterbirth  Kenard Gower, MSN, CNM 03/31/2013, 1:26 PM

## 2013-03-31 NOTE — Progress Notes (Signed)
Discuss with patient and spouse plan of care: AROM, ambulation, submersion in tub when in active labor pattern, and continuous fetal monitoring while in tub.  *Dr. Billy Coast made aware of plan - agrees with plan  Kenard Gower., MSN, CNM 03/31/2013 10:10 AM

## 2013-03-31 NOTE — Progress Notes (Signed)
Patient ID: Nina Rodriguez, female   DOB: 12/23/1980, 32 y.o.   MRN: [id number] S: Having a hard time finding relief with hydrotherapy, pain minimally controlled with hydrotherapy.  Requesting epidural asap. (+) pelvic pressure with every contraction.   O: Filed Vitals:   03/31/13 1203 03/31/13 1412 03/31/13 1503 03/31/13 1614  BP: 121/71 120/70 108/59 118/69  Pulse: 72 83 89 92  Temp:  97.9 F (36.6 C)  98.6 F (37 C)  TempSrc:    Oral  Resp: 14 20 24 24   Height:      Weight:      SpO2:         FHT:  FHR: 135 bpm, variability: minimal ,  accelerations:  10 x 10 Present,  decelerations:  Absent UC:   regular, every 2-3 minutes by palpation- suboptimal tracing with wireless tocometer SVE:   Dilation: 6 Effacement (%): 90 Station: -1 Exam by:: Carloyn Jaeger, CNM   A / P: Spontaneous labor, slow progression  Fetal Wellbeing:  Category I Pain Control:  hydrotherapy Out of tub: 1620  Anticipated MOD:  NSVD  Kenard Gower, MSN, CNM 03/31/2013, 4:25 PM

## 2013-03-31 NOTE — Progress Notes (Addendum)
Patient ID: Nina Rodriguez, female   DOB: 03-Jan-1981, 32 y.o.   MRN: [id number]   Subjective: Nina Rodriguez is a 32 y.o. G3P1011 at [redacted]w[redacted]d by LMP admitted for active labor.  Objective: Filed Vitals:   03/31/13 0921 03/31/13 0943  BP: 126/65   Pulse: 81   Temp: 98 F (36.7 C)   TempSrc: Oral   Resp: 20   Height:  5\' 6"  (1.676 m)  Weight:  95.709 kg (211 lb)    FHT:  FHR: 125 bpm, variability: moderate,  accelerations:  Abscent,  decelerations:  Absent UC:   regular, every 5-7 minutes SVE:   Dilation: 5 Effacement (%): 90 Station: -1 Exam by:: Carloyn Jaeger, CNM AROM - moderate amount of clear fluid   Labs:   Recent Labs  03/31/13 0950  WBC 6.9  HGB 11.5*  HCT 31.9*  PLT 207    Assessment / Plan: Spontaneous labor, progressing normally AROM - clear fluid  Labor: Progressing normally Preeclampsia:  N/A Fetal Wellbeing:  Category II Pain Control:  Labor support without medications, planning hydrotherapy I/D:  n/a Anticipated MOD:  NSVD-planning waterbirth  Kenard Gower, MSN, CNM 03/31/2013, 10:33 AM

## 2013-03-31 NOTE — Anesthesia Preprocedure Evaluation (Deleted)
Anesthesia Evaluation  Patient identified by MRN, date of birth, ID band Patient awake    Reviewed: Allergy & Precautions, H&P , NPO status , Patient's Chart, lab work & pertinent test results  Airway Mallampati: II TM Distance: >3 FB Neck ROM: full    Dental no notable dental hx.    Pulmonary neg pulmonary ROS,    Pulmonary exam normal       Cardiovascular negative cardio ROS      Neuro/Psych negative neurological ROS  negative psych ROS   GI/Hepatic negative GI ROS, Neg liver ROS,   Endo/Other  negative endocrine ROS  Renal/GU negative Renal ROS  negative genitourinary   Musculoskeletal negative musculoskeletal ROS (+)   Abdominal Normal abdominal exam  (+)   Peds  Hematology negative hematology ROS (+)   Anesthesia Other Findings   Reproductive/Obstetrics (+) Pregnancy                           Anesthesia Physical Anesthesia Plan  ASA: II  Anesthesia Plan: Epidural   Post-op Pain Management:    Induction:   Airway Management Planned:   Additional Equipment:   Intra-op Plan:   Post-operative Plan:   Informed Consent: I have reviewed the patients History and Physical, chart, labs and discussed the procedure including the risks, benefits and alternatives for the proposed anesthesia with the patient or authorized representative who has indicated his/her understanding and acceptance.     Plan Discussed with:   Anesthesia Plan Comments:         Anesthesia Quick Evaluation  

## 2013-03-31 NOTE — Progress Notes (Signed)
Patient ID: Nina Rodriguez, female   DOB: 1980/12/01, 32 y.o.   MRN: [id number] S: Doing well.  Back in room from ambulating in hallway.  Pain tolerated with breathing techniques.  (+) pelvic pressure with contractions.  O: Filed Vitals:   03/31/13 1118 03/31/13 1159 03/31/13 1203 03/31/13 1412  BP: 119/69  121/71 120/70  Pulse: 68 125 72 83  Temp: 98.2 F (36.8 C)   97.9 F (36.6 C)  TempSrc:      Resp: 12  14 20   Height:      Weight:      SpO2:  88%       FHT:  FHR: 130 bpm, variability: moderate,  accelerations:  Present,  decelerations:  Absent UC:   regular, every 2-6 minutes SVE:   Dilation: 5 Effacement (%): 90 Station: -1 Exam by:: Nina Rodriguez, CNM   A / P: Spontaneous labor AROM-clear  Fetal Wellbeing:  Category I Pain Control:  Labor support without medications In tub: 1501; temp 100.3  Anticipated MOD:  NSVD-planning water birth  Nina Rodriguez, M, MSN, CNM 03/31/2013, 3:02 PM

## 2013-03-31 NOTE — H&P (Signed)
OB ADMISSION/ HISTORY & PHYSICAL:  Admission Date: 03/31/2013  8:51 AM  Admit Diagnosis: Labor  Nina Rodriguez is a 32 y.o. female presenting for advanced dilation and irregular contractions.  Prenatal History: G3P1011   EDC : 03/29/2013, by LMP Prenatal care at [facility] since 9.[redacted] weeks gestation Primary Care Provider at [facility]: Dr. Cherly Hensen  Prenatal course complicated by none  Prenatal Labs: ABO, Rh:  A Positive Antibody: Negative (05/02 0000) Rubella:  Immune RPR: Nonreactive (05/02 0000)  HBsAg:   HIV: Non-reactive (05/02 0000)  GBS: Negative (11/05 0000)  1 hr Glucola : 159 3 hr Glucola : 97, 103, 113, 83   Medical / Surgical History :  Past medical history:  Past Medical History  Diagnosis Date  . Gilbert syndrome   . Active labor (plan water labor/birth) 03/07/2011  . Postpartum care following vaginal delivery (water birth 11/9/) 03/07/2011  . Normal breast feeding   . Normal labor 03/31/2013  . Medical history non-contributory   . Normal labor 03/31/2013     Past surgical history:  Past Surgical History  Procedure Laterality Date  . Mouth surgery      age 20  . No past surgeries       Family History:  Family History  Problem Relation Age of Onset  . Hypertension Mother   . Hypertension Father   . Diabetes Sister   . Hypertension Sister   . Diabetes Paternal Uncle   . Hypertension Maternal Grandmother   . Hypertension Maternal Grandfather   . Diabetes Paternal Grandmother   . Hypertension Paternal Grandmother   . Diabetes Paternal Grandfather   . Hypertension Paternal Grandfather      Social History:  reports that she has never smoked. She has never used smokeless tobacco. She reports that she does not drink alcohol or use illicit drugs.   Allergies: Review of patient's allergies indicates no known allergies.    Current Medications at time of admission:  Prescriptions prior to admission  Medication Sig Dispense  Refill  . Prenatal Vit-Min-FA-Fish Oil ([facility] PRENATAL GUMMY PO) Take 2 tablets by mouth daily.          Review of Systems: Review of Systems  Constitutional: Negative.   HENT: Negative.   Eyes: Negative.   Respiratory: Negative.   Cardiovascular: Negative.   Gastrointestinal: Negative.   Genitourinary: Negative.   Musculoskeletal: Negative.   Skin: Negative.   Neurological: Negative.   Endo/Heme/Allergies: Negative.   Psychiatric/Behavioral: Negative.    Results for orders placed during the hospital encounter of 03/31/13 (from the past 24 hour(s))  CBC     Status: Abnormal   Collection Time    03/31/13  9:50 AM      Result Value Range   WBC 6.9  4.0 - 10.5 K/uL   RBC 3.87  3.87 - 5.11 MIL/uL   Hemoglobin 11.5 (*) 12.0 - 15.0 g/dL   HCT 16.1 (*) 09.6 - 04.5 %   MCV 82.4  78.0 - 100.0 fL   MCH 29.7  26.0 - 34.0 pg   MCHC 36.1 (*) 30.0 - 36.0 g/dL   RDW 40.9  81.1 - 91.4 %   Platelets 207  150 - 400 K/uL     Physical Exam:  Dilation: 5 Effacement (%): 90 Station: -1 Exam by:: Carloyn Jaeger, CNM Filed Vitals:   03/31/13 0921  BP: 126/65  Pulse: 81  Temp: 98 F (36.7 C)  Resp: 20   General: A&O x 3, NAD  Heart: RRR Lungs: CTAB Abdomen: gravid, soft, non-tender, normal bowel sounds Extremities: atraumatic, normal ROM, no edema, varicosities or signs of DVT Genitalia / VE: normal genitalia / 5/90/-1/vtx FHR: 125 reassuring TOCO: 5-7 mins, mild  Labs:     Recent Labs  03/31/13 0950  WBC 6.9  HGB 11.5*  HCT 31.9*  PLT 207     Assessment:  32 y.o. G3P1011 at [redacted]w[redacted]d  1. Labor: early 2. Fetal Wellbeing: Category 2  3. Pain Control: none - planning hydrotherapy 4. GBS: Negative  Plan:  1. Admit to BS 2. Routine L&D orders 3. AROM     Dr Billy Coast notified of admission / plan of care    Kenard Gower, MSN, CNM 03/31/2013, 10:53 AM

## 2013-04-01 LAB — CBC
HCT: 32.1 % — ABNORMAL LOW (ref 36.0–46.0)
MCH: 29.4 pg (ref 26.0–34.0)
MCHC: 35.8 g/dL (ref 30.0–36.0)
MCV: 82.1 fL (ref 78.0–100.0)
Platelets: 216 10*3/uL (ref 150–400)
RDW: 13.7 % (ref 11.5–15.5)

## 2013-04-01 MED ORDER — OXYCODONE-ACETAMINOPHEN 5-325 MG PO TABS: 1.0000 | ORAL_TABLET | ORAL | Status: DC | PRN

## 2013-04-01 MED ORDER — IBUPROFEN 600 MG PO TABS: 600.0000 mg | ORAL_TABLET | Freq: Four times a day (QID) | ORAL | Status: DC

## 2013-04-01 NOTE — Discharge Summary (Signed)
Obstetric Discharge Summary  Reason for Admission: onset of labor Prenatal Procedures: NST and ultrasound Intrapartum Procedures: augmentation with AROM / spontaneous vaginal delivery Postpartum Procedures: none Complications-Operative and Postpartum: none Hemoglobin  Date Value Range Status  04/01/2013 11.5* 12.0 - 15.0 g/dL Final     HCT  Date Value Range Status  04/01/2013 32.1* 36.0 - 46.0 % Final    Physical Exam:  General: alert, cooperative and no distress Lochia: appropriate Uterine Fundus: firm DVT Evaluation: No evidence of DVT seen on physical exam.  Discharge Diagnoses: Term Pregnancy-delivered  Discharge Information: Date: 04/01/2013 Activity: pelvic rest Diet: routine Medications: PNV, Ibuprofen and Percocet Condition: stable Instructions: refer to practice specific booklet Discharge to: home Follow-up Information   Follow up with Nina A, MD. Schedule an appointment as soon as possible for Rodriguez visit in 6 weeks.   Specialty:  Obstetrics and Gynecology   Contact information:   [address] [place] [address] [phone]       Newborn Data: Live born female  Birth Weight: 7 lb 7.4 oz (3385 g) APGAR: 8, 9  Home with mother.  Nina Rodriguez 04/01/2013, 10:14 AM

## 2013-04-01 NOTE — Lactation Note (Signed)
This note was copied from the chart of Boy Vanesha Zaugg. Lactation Consultation Note  Patient Name: Boy Danilyn Osmundson Today's Date: 04/01/2013 Reason for consult: Follow-up assessment;Difficult latch Mom called for assist with latching baby to right breast. She has not been able to get baby to sustain a latch on the right breast. He is nursing well on the left per Mom's reports. Parents report baby has been spitty his 1st 24 hours. Assisted Mom with latching to right breast in football hold. Demonstrated to Mom how to obtain good depth with latch. After few attempts, baby developed a good suckling pattern, with swallows noted. Baby nursed for about 10 minutes, then became sleepy. Advised Mom to ask for assist as needed.   Maternal Data Formula Feeding for Exclusion: No Infant to breast within first hour of birth: Yes Has patient been taught Hand Expression?: Yes Does the patient have breastfeeding experience prior to this delivery?: Yes  Feeding Feeding Type: Breast Fed Length of feed: 10 min  LATCH Score/Interventions Latch: Repeated attempts needed to sustain latch, nipple held in mouth throughout feeding, stimulation needed to elicit sucking reflex. (right breast) Intervention(s): Adjust position;Assist with latch;Breast massage;Breast compression  Audible Swallowing: A few with stimulation  Type of Nipple: Everted at rest and after stimulation  Comfort (Breast/Nipple): Soft / non-tender     Hold (Positioning): Assistance needed to correctly position infant at breast and maintain latch.  LATCH Score: 7  Lactation Tools Discussed/Used     Consult Status Consult Status: Follow-up Date: 04/02/13 Follow-up type: In-patient    Alfred Levins 04/01/2013, 4:28 PM

## 2013-04-01 NOTE — Progress Notes (Signed)
PT TEARY EYED OVER GRANDMOTHER DEATH AND FUNERAL TOMORROW. possible PLAN  IF INFANT NOT READY TO GO HOME IN AM TO MAKE INFANT BABY PT AND ALLOW  HUSBAND TO STAY WITH INFANT AND ALLOW MOTHER TO BE DISCHARGED AND GO TO FUNERAL . PT STATED SHE WOULD THINK ABOUT T. INFANT NOT VOIDED, NEEDS CIRCUMCISION AND ? BILI  ISSUES. RN  spoke with house coverage concerning issue and Nursery RN aware on nights . Decision to be made on day shift

## 2013-04-01 NOTE — Progress Notes (Signed)
POSTPARTUM DAY # 1 S/P SVB   S:         Reports feeling well - plans DC this PM             Tolerating po intake / no nausea / no vomiting             Bleeding is light             Pain controlled with motrin and percocet             Up ad lib / ambulatory/ voiding QS  Newborn breast feeding  / Circumcision planned today   O:  VS: BP 102/46  Pulse 70  Temp(Src) 97.2 F (36.2 C) (Oral)  Resp 20  Ht 5\' 6"  (1.676 m)  Wt 95.709 kg (211 lb)  BMI 34.07 kg/m2  SpO2 97%   LABS:               Recent Labs  03/31/13 0950 04/01/13 0550  WBC 6.9 16.7*  HGB 11.5* 11.5*  PLT 207 216               Bloodtype:  A positive  Rubella:         Immune                                 Physical Exam:             Alert and Oriented X3  Lungs: Clear and unlabored  Heart: regular rate and rhythm / no mumurs  Abdomen: soft, non-tender, non-distended              Fundus: firm, non-tender, U-1             Perineum: intact  Lochia: light  Extremities: no edema, no calf pain or tenderness, negative Homans  A:        PPD # 1 S/P SVB              P:        Routine postoperative care              DC home     Marlinda Mike CNM, MSN, [facility] 04/01/2013, 9:37 AM

## 2014-02-27 ENCOUNTER — Encounter (HOSPITAL_COMMUNITY): Payer: Self-pay | Admitting: Obstetrics and Gynecology

## 2020-04-28 NOTE — L&D Delivery Note (Signed)
Delivery Note   Kennis, Buell Haliyah [[id number]]  At 7:49 PM a viable and healthy female was delivered via Vaginal, Spontaneous (Presentation: vtx; LOA ).  APGAR: 8, 9; weight pending .   Placenta status: spontaneous, intact sent to path , .  Cord:  with the following complications: none( clamp on cord A).  Anesthesia:  epidural Episiotomy: None Lacerations: None Suture Repair:  n/a Est. Blood Loss (mL):  200    Shadow, Stiggers Aundrea [[id number]]  At 8:00 PM a viable and healthy female was delivered via Vaginal, Spontaneous (Presentation: vtx; LOA ).  APGAR: 7, 8; weight  pending.   Placenta status: spontaneous intact,( twin) to path .  Cord:  with the following complications: .  Anesthesia:  epidural Episiotomy: None Lacerations: None Suture Repair:  n/a Est. Blood Loss (mL):  200   Mom to postpartum.   Baby A to Couplet care / Skin to Skin.   Baby B to Couplet care / Skin to Skin.  Jariyah Hackley A Kalan Yeley 12/04/2020, 8:18 PM

## 2020-06-07 LAB — HEPATITIS C ANTIBODY: HCV Ab: NEGATIVE

## 2020-06-07 LAB — OB RESULTS CONSOLE HIV ANTIBODY (ROUTINE TESTING): HIV: NONREACTIVE

## 2020-06-07 LAB — OB RESULTS CONSOLE HEPATITIS B SURFACE ANTIGEN: Hepatitis B Surface Ag: NEGATIVE

## 2020-07-11 ENCOUNTER — Telehealth: Payer: Self-pay

## 2020-07-11 NOTE — Telephone Encounter (Signed)
Attempted to call patient to schedule for Detail, Anatomy ultrasound.  Not available at this time and mailbox is full.  Will call again

## 2020-07-12 ENCOUNTER — Other Ambulatory Visit: Payer: Self-pay | Admitting: Obstetrics and Gynecology

## 2020-07-12 DIAGNOSIS — O09522 Supervision of elderly multigravida, second trimester: Secondary | ICD-10-CM

## 2020-08-09 ENCOUNTER — Other Ambulatory Visit: Payer: No Typology Code available for payment source

## 2020-08-09 ENCOUNTER — Ambulatory Visit: Payer: No Typology Code available for payment source

## 2020-08-22 ENCOUNTER — Other Ambulatory Visit: Payer: Self-pay | Admitting: Obstetrics and Gynecology

## 2020-08-22 ENCOUNTER — Ambulatory Visit: Payer: No Typology Code available for payment source | Attending: Obstetrics and Gynecology

## 2020-08-22 ENCOUNTER — Other Ambulatory Visit: Payer: Self-pay

## 2020-08-22 ENCOUNTER — Ambulatory Visit: Payer: No Typology Code available for payment source | Admitting: *Deleted

## 2020-08-22 ENCOUNTER — Encounter: Payer: Self-pay | Admitting: *Deleted

## 2020-08-22 VITALS — BP 111/66 | HR 79

## 2020-08-22 DIAGNOSIS — O09522 Supervision of elderly multigravida, second trimester: Secondary | ICD-10-CM

## 2020-08-22 DIAGNOSIS — Z3A20 20 weeks gestation of pregnancy: Secondary | ICD-10-CM | POA: Insufficient documentation

## 2020-08-22 DIAGNOSIS — O30032 Twin pregnancy, monochorionic/diamniotic, second trimester: Secondary | ICD-10-CM | POA: Diagnosis not present

## 2020-08-23 ENCOUNTER — Other Ambulatory Visit: Payer: Self-pay | Admitting: *Deleted

## 2020-08-23 DIAGNOSIS — O30032 Twin pregnancy, monochorionic/diamniotic, second trimester: Secondary | ICD-10-CM

## 2020-08-23 NOTE — Progress Notes (Unsigned)
MFM brief consult  Nina Rodriguez is a 40 yo G5P2 who is here for a detailed anatomy for suspected singleton pregnancy.  She had a negative AFP and reports a normal cell free DNA.  Today we observed monochorionic diamniotic twin pregnancy. Measurements for Twin A and B are consistent with dates, which was by a an early ultrasound. The amniotic fluid appears normal and concordinant in Twin A and B, in addition bladder and stomachs were equally present. Suboptimal views were observed in the anatomy review of Twin A and B, please see the above report for details. This was seen secondary to fetal position and multiple gestation.  The potential risks associated with a monochorionic twin gestation were discussed.  This discussion included a review of the increased risk of miscarriages, anomalies, preterm labor, and/or delivery, malpresentation, C-section, gestational diabetes, and/or preeclampsia.  Additionally with respect to fetuses, there is an increased risk for fetal growth restriction of one or both twins, preterm labor, and associated morbidity, and intrauterine fetal demise.  We also discussed the risk of twin twin transfusion sysndrome (TTTS), which occurs in approximately 15% of monochorionic twin pregnancies. This condition is due to unequal placental vascular anastomoses between the fetuses. The initial presentation is in the form of oligohydramnios in the donor twin and polyhydramnios in the recipient twin. These changes can rapidly progress to include Doppler abnormalities and fetal death. No evidence of TTTS is seen today, however, surveillance every two weeks through the second trimester is indicated.    In addition, monochorionic diamniotic twin pregnancy is associated with a 3-5% risk of congenital heart defect. As such, we recommend fetal echocardiogram be performed.   Lastly, we advise serial growth sonograms every 3-4  weeks in the third trimester with the initiation of antenatal testing in  the form weekly BPP at 32 weeks and delivery no later than 37 weeks.    I placed the referral for a fetal echocardiogram today and we have scheduled her to return in 3-4 weeks.  I spent 30 minute speaking with Ms. Stills and her husband (telephonically) with 50% in face to face consultation.  All questions answered.  Novella Olive, MD

## 2020-09-06 NOTE — Progress Notes (Signed)
 [facility]  [address] [place] [address] Ph: [phone]  Fax: [phone] Appt: [phone]      Date   09/06/2020  Patient information   Nina Rodriguez [address] [place] [address] T: [phone]  E: [email or web address] Date of birth: [birth year]  Age: 40 y.o.  Requesting provider   Rutherford Dickie Caldron, MD [address] [place],  [place] [address] T: [phone]  F: [phone]  Chief complaint   Chief Complaint  Patient presents with  . Twin Gestation  . Advanced Maternal Age     History of present illness  I had the pleasure of seeing Ms. Nina Rodriguez at the [employer] office for fetal cardiac consultation and fetal echocardiography at the request of Rutherford Dickie Caldron*. Records were reviewed, and a summary of those records is integrated within the history of present illness. History is obtained from chart review and patient.  Ms. Nina Rodriguez is a 40 y.o. year old G30P2012 woman currently [redacted]w[redacted]d with a monochorionic-diamniotic twin pregnanacy. Estimated Date of Delivery: 01/03/21.  The indication for today's visit includes monochorionic-diamniotic twin gestation at risk for twin twin transfusion syndrome. She denies any other complications with this pregnancy. She has felt good fetal movement. She plans to [facility]. The available medical record was reviewed in detail and is in agreement with the HPI and past medical history.  Past medical/surgical history  History reviewed. No pertinent past medical history. Past Surgical History:  Procedure Laterality Date  . wisdom tooth removal       Medications  Ms. Nina Rodriguez has a current medication list which includes the following prescription(s): aspirin and prenatal 25/iron fum/folic/dha.   Allergies  No Known Allergies  Family history  There is no known family history of congenital heart  disease, arrhythmias, sudden cardiac death, or other birth defects.  Social History   Social History   Tobacco Use  Smoking Status Never Smoker  Smokeless Tobacco Never Used   Works as a Merchandiser, retail at a call center. She lives with her husband and 2 older sons.    Review of Systems  A review of systems was negative except as noted in the HPI or below.   Physical Exam  BP 125/75 (BP Location: Right upper arm, Patient Position: Sitting, BP Cuff Size: Adult)   Pulse 87   Resp 22   Ht 167.5 cm (5' 5.95)   Wt 96.3 kg (212 lb 4.9 oz)   LMP 03/29/2020   BMI 34.32 kg/m   Patient is well appearing and in no distress.  She has normal work of breathing. Abdomen is significant for a gravida uterus but is otherwise soft and non-tender. Extremities - no swelling or edema noted. Neuro - grossly intact without focal deficits.  Fetal Echocardiogram twin A   A complete study was performed today, which was technically good.  Please see separate echocardiogram report for full details. There is normal fetal cardiac anatomy and function.  No major heart disease was identified.  Fetal Echocardiogram twin B  A complete study was performed today, which was technically good.  Please see separate echocardiogram report for full details. There is normal fetal cardiac anatomy and function.  No major heart disease was identified.  Diagnosis     ICD-10-CM   1. Monochorionic diamniotic twin gestation in second trimester  O30.032 PEDS ECHO Fetal Echo    PEDS ECHO Fetal Echo    PEDS ECHO Fetal  Echo    PEDS ECHO Fetal Echo      Impressions  I am happy to report that  Ms. Nina Rodriguez's fetal hearts appears normal. There were no concerning findings suggestive of TTTS. These results were discussed in detail, and all questions were answered.  There is no fetal cardiac indication to alter her prenatal care or delivery plan.  The fetus' do not require any further cardiac testing. TTTS typically develops between 16  and 26 weeks.  If concerns arise at follow up obstetric visits, I would be happy to re-evaluate.   The limitations to fetal echocardiography include the presence of small to moderate atrial and ventricular septal defects, mild valve abnormalities, persistence of the ductus arteriosus after birth, postnatal development of coarctation of the aorta, and other cardiac and vascular anomalies too subtle to be imaged prenatally. These limitations were discussed with the patient.  Although no scheduled postnatal cardiac testing is indicated at this time, the babies should have appropriate cardiac evaluation if there are any clinical concerns after delivery.  We did discuss the increased risk for persistence of the ductus arteriosus and hemodynamic significance if the twins are premature.      Recommendation(s)  No further prenatal cardiac follow-up is required unless any new cardiac concerns are noted.  No fetal cardiac indication to alter newborn care. No postnatal cardiology consultation or echocardiogram is indicated at this time. If there are concerns for heart disease after birth, then pediatric cardiology should be consulted.   It was my pleasure to meet and evaluate Ms. Nina Rodriguez today. If there are any questions or concerns regarding this evaluation, please do not hesitate to contact me.    I was personally with the patient for 1.5 hours. More than 50% of this time was spent doing counseling.  Sincerely,  Darcey Harlem, MD, MPH  Pediatric Cardiology [facility] Phone: [phone], Fax: [phone] On call: [phone] or [phone] [email or web address]  I personally performed the service. (TP)  MCALLISTER CLAUD HARLEM, MD

## 2020-09-07 ENCOUNTER — Other Ambulatory Visit: Payer: Self-pay

## 2020-09-07 ENCOUNTER — Ambulatory Visit: Payer: No Typology Code available for payment source | Admitting: *Deleted

## 2020-09-07 ENCOUNTER — Other Ambulatory Visit: Payer: Self-pay | Admitting: *Deleted

## 2020-09-07 ENCOUNTER — Ambulatory Visit: Payer: No Typology Code available for payment source | Attending: Maternal & Fetal Medicine

## 2020-09-07 ENCOUNTER — Other Ambulatory Visit: Payer: Self-pay | Admitting: Maternal & Fetal Medicine

## 2020-09-07 ENCOUNTER — Encounter: Payer: Self-pay | Admitting: *Deleted

## 2020-09-07 VITALS — BP 108/65 | HR 70

## 2020-09-07 DIAGNOSIS — O358XX0 Maternal care for other (suspected) fetal abnormality and damage, not applicable or unspecified: Secondary | ICD-10-CM

## 2020-09-07 DIAGNOSIS — O30032 Twin pregnancy, monochorionic/diamniotic, second trimester: Secondary | ICD-10-CM | POA: Insufficient documentation

## 2020-09-07 DIAGNOSIS — O321XX1 Maternal care for breech presentation, fetus 1: Secondary | ICD-10-CM | POA: Diagnosis not present

## 2020-09-07 DIAGNOSIS — O321XX2 Maternal care for breech presentation, fetus 2: Secondary | ICD-10-CM | POA: Diagnosis not present

## 2020-09-07 DIAGNOSIS — O09522 Supervision of elderly multigravida, second trimester: Secondary | ICD-10-CM | POA: Diagnosis not present

## 2020-09-07 DIAGNOSIS — Z3A23 23 weeks gestation of pregnancy: Secondary | ICD-10-CM

## 2020-09-19 ENCOUNTER — Ambulatory Visit: Payer: No Typology Code available for payment source | Attending: Maternal & Fetal Medicine

## 2020-09-19 ENCOUNTER — Ambulatory Visit: Payer: No Typology Code available for payment source | Admitting: *Deleted

## 2020-09-19 ENCOUNTER — Encounter: Payer: Self-pay | Admitting: *Deleted

## 2020-09-19 ENCOUNTER — Other Ambulatory Visit: Payer: Self-pay

## 2020-09-19 VITALS — BP 125/69 | HR 71

## 2020-09-19 DIAGNOSIS — O30019 Twin pregnancy, monochorionic/monoamniotic, unspecified trimester: Secondary | ICD-10-CM | POA: Diagnosis present

## 2020-09-19 DIAGNOSIS — O09522 Supervision of elderly multigravida, second trimester: Secondary | ICD-10-CM | POA: Diagnosis not present

## 2020-09-19 DIAGNOSIS — O358XX1 Maternal care for other (suspected) fetal abnormality and damage, fetus 1: Secondary | ICD-10-CM | POA: Diagnosis not present

## 2020-09-19 DIAGNOSIS — Z3A24 24 weeks gestation of pregnancy: Secondary | ICD-10-CM

## 2020-09-19 DIAGNOSIS — O30032 Twin pregnancy, monochorionic/diamniotic, second trimester: Secondary | ICD-10-CM | POA: Insufficient documentation

## 2020-10-03 ENCOUNTER — Telehealth: Payer: Self-pay

## 2020-10-03 NOTE — Telephone Encounter (Signed)
FETAL ECHO COMPLETED 09/06/2020.

## 2020-10-08 ENCOUNTER — Ambulatory Visit: Payer: No Typology Code available for payment source | Admitting: *Deleted

## 2020-10-08 ENCOUNTER — Ambulatory Visit: Payer: No Typology Code available for payment source | Attending: Obstetrics and Gynecology

## 2020-10-08 ENCOUNTER — Encounter: Payer: Self-pay | Admitting: *Deleted

## 2020-10-08 ENCOUNTER — Other Ambulatory Visit: Payer: Self-pay | Admitting: *Deleted

## 2020-10-08 ENCOUNTER — Other Ambulatory Visit: Payer: Self-pay

## 2020-10-08 VITALS — BP 126/80 | HR 69

## 2020-10-08 DIAGNOSIS — O321XX0 Maternal care for breech presentation, not applicable or unspecified: Secondary | ICD-10-CM

## 2020-10-08 DIAGNOSIS — O358XX0 Maternal care for other (suspected) fetal abnormality and damage, not applicable or unspecified: Secondary | ICD-10-CM

## 2020-10-08 DIAGNOSIS — O30039 Twin pregnancy, monochorionic/diamniotic, unspecified trimester: Secondary | ICD-10-CM | POA: Diagnosis present

## 2020-10-08 DIAGNOSIS — Z3A27 27 weeks gestation of pregnancy: Secondary | ICD-10-CM

## 2020-10-08 DIAGNOSIS — O30032 Twin pregnancy, monochorionic/diamniotic, second trimester: Secondary | ICD-10-CM | POA: Insufficient documentation

## 2020-10-08 DIAGNOSIS — O09522 Supervision of elderly multigravida, second trimester: Secondary | ICD-10-CM

## 2020-10-19 ENCOUNTER — Ambulatory Visit: Payer: No Typology Code available for payment source | Attending: Obstetrics and Gynecology

## 2020-10-19 ENCOUNTER — Ambulatory Visit: Payer: No Typology Code available for payment source | Admitting: *Deleted

## 2020-10-19 ENCOUNTER — Encounter: Payer: Self-pay | Admitting: *Deleted

## 2020-10-19 ENCOUNTER — Other Ambulatory Visit: Payer: Self-pay

## 2020-10-19 VITALS — BP 123/74 | HR 77

## 2020-10-19 DIAGNOSIS — O30033 Twin pregnancy, monochorionic/diamniotic, third trimester: Secondary | ICD-10-CM | POA: Diagnosis not present

## 2020-10-19 DIAGNOSIS — O358XX1 Maternal care for other (suspected) fetal abnormality and damage, fetus 1: Secondary | ICD-10-CM | POA: Diagnosis not present

## 2020-10-19 DIAGNOSIS — Z3A29 29 weeks gestation of pregnancy: Secondary | ICD-10-CM | POA: Diagnosis not present

## 2020-10-19 DIAGNOSIS — O30032 Twin pregnancy, monochorionic/diamniotic, second trimester: Secondary | ICD-10-CM | POA: Insufficient documentation

## 2020-10-19 DIAGNOSIS — O09523 Supervision of elderly multigravida, third trimester: Secondary | ICD-10-CM | POA: Diagnosis not present

## 2020-11-05 ENCOUNTER — Other Ambulatory Visit: Payer: Self-pay

## 2020-11-05 ENCOUNTER — Ambulatory Visit: Payer: No Typology Code available for payment source | Admitting: *Deleted

## 2020-11-05 ENCOUNTER — Ambulatory Visit: Payer: No Typology Code available for payment source | Attending: Obstetrics and Gynecology

## 2020-11-05 VITALS — BP 126/86 | HR 69

## 2020-11-05 DIAGNOSIS — O09523 Supervision of elderly multigravida, third trimester: Secondary | ICD-10-CM

## 2020-11-05 DIAGNOSIS — O30033 Twin pregnancy, monochorionic/diamniotic, third trimester: Secondary | ICD-10-CM

## 2020-11-05 DIAGNOSIS — Z3A31 31 weeks gestation of pregnancy: Secondary | ICD-10-CM

## 2020-11-05 DIAGNOSIS — O30039 Twin pregnancy, monochorionic/diamniotic, unspecified trimester: Secondary | ICD-10-CM | POA: Diagnosis not present

## 2020-11-05 DIAGNOSIS — O321XX2 Maternal care for breech presentation, fetus 2: Secondary | ICD-10-CM | POA: Diagnosis not present

## 2020-11-05 DIAGNOSIS — O358XX1 Maternal care for other (suspected) fetal abnormality and damage, fetus 1: Secondary | ICD-10-CM

## 2020-11-06 ENCOUNTER — Other Ambulatory Visit: Payer: Self-pay | Admitting: *Deleted

## 2020-11-06 DIAGNOSIS — O30033 Twin pregnancy, monochorionic/diamniotic, third trimester: Secondary | ICD-10-CM

## 2020-11-15 ENCOUNTER — Other Ambulatory Visit: Payer: Self-pay

## 2020-11-15 ENCOUNTER — Encounter
Payer: No Typology Code available for payment source | Attending: Obstetrics and Gynecology | Admitting: Skilled Nursing Facility1

## 2020-11-15 ENCOUNTER — Encounter: Payer: Self-pay | Admitting: Skilled Nursing Facility1

## 2020-11-15 DIAGNOSIS — O24419 Gestational diabetes mellitus in pregnancy, unspecified control: Secondary | ICD-10-CM | POA: Diagnosis present

## 2020-11-15 NOTE — Progress Notes (Signed)
Patient was seen for Gestational Diabetes self-management on 11/15/2020  Start time 3:21 and End time 4:30   Estimated due date: ausgust 8th-11th  Clinical: Medications: prenatal, baby aspirin  Medical History: N/A Labs: OGTT 190  Dietary and Lifestyle History:  Pt states she is going to be induced in 3 weeks but states her doctor still wants her to test her blood sugars.  Pt states her first trimester she was really sick which has leveled off but is still having trouble with breathing from where the twins are sitting.  Pt states she does skip meals due to her work schedule and works 12 hour days.  Pt states she has been eating out lunch often due to moving from her current home.  Pt states her energy level throughout the day is pretty decent.   Pt states she is really really afraid to check her blood sugars due to a fear of needles: pt was able to check her blood sugar in office today and states she will have her husband do it most often.    Physical Activity: ADL's due to finding it difficult to breath and uncomfortable pressure  Stress: pretty high due to work Sleep: states it is horrible due to not being able to get comfortable   24 hr Recall: First Meal 8:30-9: oatmeal + brown sugar or fruit Snack: Second meal: chicken salad crasant + lettuce + tomato + grapes + nuts + cream cheese + pickle spear Snack: Third meal: broccoli and cheese soup + watermelon Snack: fruit Beverages: 3-4 bottlres of water, lemonade, gingerale  NUTRITION INTERVENTION  Nutrition education (E-1) on the following topics:   Initial Follow-up  [x]  []  Definition of Gestational Diabetes [x]  []  Why dietary management is important in controlling blood glucose [x]  []  Effects each nutrient has on blood glucose levels [x]  []  Simple carbohydrates vs complex carbohydrates [x]  []  Fluid intake [x]  []  Creating a balanced meal plan []  []  Carbohydrate counting  [x]  []  When to check blood glucose  levels [x]  []  Proper blood glucose monitoring techniques [x]  []  Effect of stress and stress reduction techniques  [x]  []  Exercise effect on blood glucose levels, appropriate exercise during pregnancy [x]  []  Importance of limiting caffeine and abstaining from alcohol and smoking [x]  []  Medications used for blood sugar control during pregnancy [x]  []  Hypoglycemia and rule of 15 [x]  []  Postpartum self care   Educated pt on type 1 verses type 2 diabetes   Educated pt on insulin and how it is administered   Blood glucose monitor given: one touch verio flex Lot # x Exp: 01/25/2021 CBG: 77 mg/dL  Goals: Do not sit longer than 30 minutes and do light weights Try some arm chair workouts  Do some prenatal yoga Reduce oeverall fruit Do NOT skip meals   Patient instructed to monitor glucose levels: FBS: 60 - ? 95 mg/dL (some clinics use 90 for cutoff) 1 hour: ? 140 mg/dL 2 hour: ? mg/dL  Patient received handouts: Nutrition Diabetes and Pregnancy Carbohydrate Counting List  Patient will be seen for follow-up as needed.

## 2020-11-19 ENCOUNTER — Ambulatory Visit: Payer: No Typology Code available for payment source | Attending: Obstetrics and Gynecology

## 2020-11-19 ENCOUNTER — Other Ambulatory Visit: Payer: Self-pay

## 2020-11-19 ENCOUNTER — Ambulatory Visit: Payer: No Typology Code available for payment source | Admitting: *Deleted

## 2020-11-19 ENCOUNTER — Encounter: Payer: Self-pay | Admitting: *Deleted

## 2020-11-19 VITALS — BP 121/85 | HR 80

## 2020-11-19 DIAGNOSIS — Z3A33 33 weeks gestation of pregnancy: Secondary | ICD-10-CM | POA: Diagnosis not present

## 2020-11-19 DIAGNOSIS — O30033 Twin pregnancy, monochorionic/diamniotic, third trimester: Secondary | ICD-10-CM

## 2020-11-19 DIAGNOSIS — O09523 Supervision of elderly multigravida, third trimester: Secondary | ICD-10-CM

## 2020-11-19 DIAGNOSIS — O358XX1 Maternal care for other (suspected) fetal abnormality and damage, fetus 1: Secondary | ICD-10-CM | POA: Diagnosis not present

## 2020-11-26 ENCOUNTER — Ambulatory Visit: Payer: No Typology Code available for payment source | Attending: Obstetrics and Gynecology

## 2020-11-26 ENCOUNTER — Encounter: Payer: Self-pay | Admitting: *Deleted

## 2020-11-26 ENCOUNTER — Other Ambulatory Visit: Payer: Self-pay

## 2020-11-26 ENCOUNTER — Ambulatory Visit: Payer: No Typology Code available for payment source | Admitting: *Deleted

## 2020-11-26 VITALS — BP 127/90 | HR 81

## 2020-11-26 DIAGNOSIS — O09523 Supervision of elderly multigravida, third trimester: Secondary | ICD-10-CM

## 2020-11-26 DIAGNOSIS — Z3A34 34 weeks gestation of pregnancy: Secondary | ICD-10-CM

## 2020-11-26 DIAGNOSIS — O358XX1 Maternal care for other (suspected) fetal abnormality and damage, fetus 1: Secondary | ICD-10-CM

## 2020-11-26 DIAGNOSIS — O30033 Twin pregnancy, monochorionic/diamniotic, third trimester: Secondary | ICD-10-CM | POA: Diagnosis present

## 2020-12-03 ENCOUNTER — Other Ambulatory Visit: Payer: Self-pay

## 2020-12-03 ENCOUNTER — Ambulatory Visit: Payer: No Typology Code available for payment source | Admitting: *Deleted

## 2020-12-03 ENCOUNTER — Ambulatory Visit (HOSPITAL_BASED_OUTPATIENT_CLINIC_OR_DEPARTMENT_OTHER): Payer: No Typology Code available for payment source

## 2020-12-03 ENCOUNTER — Encounter: Payer: Self-pay | Admitting: *Deleted

## 2020-12-03 VITALS — BP 152/99 | HR 76

## 2020-12-03 DIAGNOSIS — O133 Gestational [pregnancy-induced] hypertension without significant proteinuria, third trimester: Secondary | ICD-10-CM | POA: Diagnosis not present

## 2020-12-03 DIAGNOSIS — O30033 Twin pregnancy, monochorionic/diamniotic, third trimester: Secondary | ICD-10-CM | POA: Insufficient documentation

## 2020-12-03 DIAGNOSIS — Z3A35 35 weeks gestation of pregnancy: Secondary | ICD-10-CM

## 2020-12-03 DIAGNOSIS — O09523 Supervision of elderly multigravida, third trimester: Secondary | ICD-10-CM | POA: Diagnosis not present

## 2020-12-04 ENCOUNTER — Other Ambulatory Visit: Payer: Self-pay

## 2020-12-04 ENCOUNTER — Inpatient Hospital Stay (HOSPITAL_COMMUNITY): Payer: No Typology Code available for payment source | Admitting: Anesthesiology

## 2020-12-04 ENCOUNTER — Inpatient Hospital Stay (HOSPITAL_COMMUNITY)
Admission: AD | Admit: 2020-12-04 | Discharge: 2020-12-06 | DRG: 807 | Disposition: A | Discharge status: Home / Self Care | Payer: No Typology Code available for payment source | Attending: Obstetrics and Gynecology | Admitting: Obstetrics and Gynecology

## 2020-12-04 ENCOUNTER — Encounter (HOSPITAL_COMMUNITY): Payer: Self-pay | Admitting: Obstetrics and Gynecology

## 2020-12-04 DIAGNOSIS — O24420 Gestational diabetes mellitus in childbirth, diet controlled: Secondary | ICD-10-CM | POA: Diagnosis present

## 2020-12-04 DIAGNOSIS — O99214 Obesity complicating childbirth: Secondary | ICD-10-CM | POA: Diagnosis present

## 2020-12-04 DIAGNOSIS — O30033 Twin pregnancy, monochorionic/diamniotic, third trimester: Secondary | ICD-10-CM | POA: Diagnosis present

## 2020-12-04 DIAGNOSIS — O134 Gestational [pregnancy-induced] hypertension without significant proteinuria, complicating childbirth: Principal | ICD-10-CM | POA: Diagnosis present

## 2020-12-04 DIAGNOSIS — O133 Gestational [pregnancy-induced] hypertension without significant proteinuria, third trimester: Secondary | ICD-10-CM

## 2020-12-04 DIAGNOSIS — Z3A35 35 weeks gestation of pregnancy: Secondary | ICD-10-CM | POA: Diagnosis not present

## 2020-12-04 DIAGNOSIS — O139 Gestational [pregnancy-induced] hypertension without significant proteinuria, unspecified trimester: Secondary | ICD-10-CM | POA: Diagnosis present

## 2020-12-04 DIAGNOSIS — Z20822 Contact with and (suspected) exposure to covid-19: Secondary | ICD-10-CM | POA: Diagnosis present

## 2020-12-04 LAB — COMPREHENSIVE METABOLIC PANEL
ALT: 22 U/L (ref 0–44)
AST: 22 U/L (ref 15–41)
Albumin: 2.5 g/dL — ABNORMAL LOW (ref 3.5–5.0)
Alkaline Phosphatase: 99 U/L (ref 38–126)
Anion gap: 11 (ref 5–15)
BUN: 5 mg/dL — ABNORMAL LOW (ref 6–20)
CO2: 17 mmol/L — ABNORMAL LOW (ref 22–32)
Calcium: 8.7 mg/dL — ABNORMAL LOW (ref 8.9–10.3)
Chloride: 106 mmol/L (ref 98–111)
Creatinine, Ser: 0.58 mg/dL (ref 0.44–1.00)
GFR, Estimated: >60 mL/min (ref >60)
Glucose, Bld: 77 mg/dL (ref 70–99)
Potassium: 3.6 mmol/L (ref 3.5–5.1)
Sodium: 134 mmol/L — ABNORMAL LOW (ref 135–145)
Total Bilirubin: 1.7 mg/dL — ABNORMAL HIGH (ref 0.3–1.2)
Total Protein: 5.8 g/dL — ABNORMAL LOW (ref 6.5–8.1)

## 2020-12-04 LAB — RESP PANEL BY RT-PCR (FLU A&B, COVID) ARPGX2
Influenza A by PCR: NEGATIVE
Influenza B by PCR: NEGATIVE
SARS Coronavirus 2 by RT PCR: NEGATIVE

## 2020-12-04 LAB — CBC
HCT: 32.7 % — ABNORMAL LOW (ref 36.0–46.0)
Hemoglobin: 11.6 g/dL — ABNORMAL LOW (ref 12.0–15.0)
MCH: 29.4 pg (ref 26.0–34.0)
MCHC: 35.5 g/dL (ref 30.0–36.0)
MCV: 82.8 fL (ref 80.0–100.0)
Platelets: 212 10*3/uL (ref 150–400)
RBC: 3.95 MIL/uL (ref 3.87–5.11)
RDW: 13.6 % (ref 11.5–15.5)
WBC: 7.2 10*3/uL (ref 4.0–10.5)
nRBC: 0 % (ref 0.0–0.2)

## 2020-12-04 LAB — TYPE AND SCREEN
ABO/RH(D): A POS
Antibody Screen: NEGATIVE

## 2020-12-04 LAB — PROTEIN / CREATININE RATIO, URINE
Creatinine, Urine: 272.43 mg/dL
Protein Creatinine Ratio: 0.12 mg/mg{Cre} (ref 0.00–0.15)
Total Protein, Urine: 32 mg/dL

## 2020-12-04 LAB — URIC ACID: Uric Acid, Serum: 4.8 mg/dL (ref 2.5–7.1)

## 2020-12-04 LAB — RPR: RPR Ser Ql: NONREACTIVE

## 2020-12-04 MED ORDER — FENTANYL-BUPIVACAINE-NACL 0.5-0.125-0.9 MG/250ML-% EP SOLN
EPIDURAL | Status: DC | PRN
  Administered 2020-12-04: 12 mL/h via EPIDURAL

## 2020-12-04 MED ORDER — OXYCODONE HCL 5 MG PO TABS: 5.0000 mg | ORAL_TABLET | ORAL | Status: DC | PRN

## 2020-12-04 MED ORDER — DIBUCAINE (PERIANAL) 1 % EX OINT: 1.0000 "application " | TOPICAL_OINTMENT | CUTANEOUS | Status: DC | PRN

## 2020-12-04 MED ORDER — LABETALOL HCL 5 MG/ML IV SOLN: 40.0000 mg | INTRAVENOUS | Status: DC | PRN

## 2020-12-04 MED ORDER — OXYCODONE-ACETAMINOPHEN 5-325 MG PO TABS
2.0000 | ORAL_TABLET | ORAL | Status: DC | PRN
Start: 2020-12-04 — End: 2020-12-04

## 2020-12-04 MED ORDER — ONDANSETRON HCL 4 MG/2ML IJ SOLN: 4.0000 mg | INTRAMUSCULAR | Status: DC | PRN

## 2020-12-04 MED ORDER — ACETAMINOPHEN 325 MG PO TABS: 650.0000 mg | ORAL_TABLET | ORAL | Status: DC | PRN

## 2020-12-04 MED ORDER — ONDANSETRON HCL 4 MG PO TABS: 4.0000 mg | ORAL_TABLET | ORAL | Status: DC | PRN

## 2020-12-04 MED ORDER — LABETALOL HCL 5 MG/ML IV SOLN: 80.0000 mg | INTRAVENOUS | Status: DC | PRN

## 2020-12-04 MED ORDER — LACTATED RINGERS IV SOLN: 500.0000 mL | Freq: Once | INTRAVENOUS | Status: DC

## 2020-12-04 MED ORDER — LIDOCAINE HCL (PF) 1 % IJ SOLN: 30.0000 mL | INTRAMUSCULAR | Status: DC | PRN

## 2020-12-04 MED ORDER — DIPHENHYDRAMINE HCL 25 MG PO CAPS: 25.0000 mg | ORAL_CAPSULE | Freq: Four times a day (QID) | ORAL | Status: DC | PRN

## 2020-12-04 MED ORDER — TERBUTALINE SULFATE 1 MG/ML IJ SOLN: 0.2500 mg | Freq: Once | INTRAMUSCULAR | Status: DC | PRN

## 2020-12-04 MED ORDER — WITCH HAZEL-GLYCERIN EX PADS: 1.0000 "application " | MEDICATED_PAD | CUTANEOUS | Status: DC | PRN

## 2020-12-04 MED ORDER — PRENATAL MULTIVITAMIN CH
1.0000 | ORAL_TABLET | Freq: Every day | ORAL | Status: DC
  Administered 2020-12-05 – 2020-12-06 (×2): 1 via ORAL
  Filled 2020-12-04 (×2): qty 1

## 2020-12-04 MED ORDER — ZOLPIDEM TARTRATE 5 MG PO TABS: 5.0000 mg | ORAL_TABLET | Freq: Every evening | ORAL | Status: DC | PRN

## 2020-12-04 MED ORDER — SOD CITRATE-CITRIC ACID 500-334 MG/5ML PO SOLN: 30.0000 mL | ORAL | Status: DC | PRN

## 2020-12-04 MED ORDER — OXYCODONE-ACETAMINOPHEN 5-325 MG PO TABS: 1.0000 | ORAL_TABLET | ORAL | Status: DC | PRN

## 2020-12-04 MED ORDER — LABETALOL HCL 5 MG/ML IV SOLN: 20.0000 mg | INTRAVENOUS | Status: DC | PRN

## 2020-12-04 MED ORDER — COCONUT OIL OIL: 1.0000 "application " | TOPICAL_OIL | Status: DC | PRN

## 2020-12-04 MED ORDER — DIPHENHYDRAMINE HCL 50 MG/ML IJ SOLN: 12.5000 mg | INTRAMUSCULAR | Status: DC | PRN

## 2020-12-04 MED ORDER — PHENYLEPHRINE 40 MCG/ML (10ML) SYRINGE FOR IV PUSH (FOR BLOOD PRESSURE SUPPORT): 80.0000 ug | PREFILLED_SYRINGE | INTRAVENOUS | Status: DC | PRN

## 2020-12-04 MED ORDER — OXYTOCIN-SODIUM CHLORIDE 30-0.9 UT/500ML-% IV SOLN
1.0000 m[IU]/min | INTRAVENOUS | Status: DC
  Administered 2020-12-04: 2 m[IU]/min via INTRAVENOUS
  Filled 2020-12-04: qty 500

## 2020-12-04 MED ORDER — FERROUS SULFATE 325 (65 FE) MG PO TABS
325.0000 mg | ORAL_TABLET | Freq: Two times a day (BID) | ORAL | Status: DC
  Administered 2020-12-05 – 2020-12-06 (×4): 325 mg via ORAL
  Filled 2020-12-04 (×4): qty 1

## 2020-12-04 MED ORDER — OXYTOCIN 10 UNIT/ML IJ SOLN: 10.0000 [IU] | Freq: Once | INTRAMUSCULAR | Status: DC

## 2020-12-04 MED ORDER — ONDANSETRON HCL 4 MG/2ML IJ SOLN: 4.0000 mg | Freq: Four times a day (QID) | INTRAMUSCULAR | Status: DC | PRN

## 2020-12-04 MED ORDER — OXYCODONE HCL 5 MG PO TABS: 10.0000 mg | ORAL_TABLET | ORAL | Status: DC | PRN

## 2020-12-04 MED ORDER — BENZOCAINE-MENTHOL 20-0.5 % EX AERO: 1.0000 "application " | INHALATION_SPRAY | CUTANEOUS | Status: DC | PRN

## 2020-12-04 MED ORDER — EPHEDRINE 5 MG/ML INJ: 10.0000 mg | INTRAVENOUS | Status: DC | PRN

## 2020-12-04 MED ORDER — LABETALOL HCL 100 MG PO TABS
100.0000 mg | ORAL_TABLET | Freq: Two times a day (BID) | ORAL | Status: DC
  Administered 2020-12-04 – 2020-12-06 (×4): 100 mg via ORAL
  Filled 2020-12-04 (×4): qty 1

## 2020-12-04 MED ORDER — LACTATED RINGERS IV SOLN
500.0000 mL | INTRAVENOUS | Status: DC | PRN
  Administered 2020-12-04: 500 mL via INTRAVENOUS

## 2020-12-04 MED ORDER — OXYTOCIN-SODIUM CHLORIDE 30-0.9 UT/500ML-% IV SOLN
2.5000 [IU]/h | INTRAVENOUS | Status: DC
  Filled 2020-12-04: qty 500

## 2020-12-04 MED ORDER — LIDOCAINE HCL (PF) 1 % IJ SOLN
INTRAMUSCULAR | Status: DC | PRN
  Administered 2020-12-04: 2 mL via EPIDURAL
  Administered 2020-12-04: 10 mL via EPIDURAL

## 2020-12-04 MED ORDER — LABETALOL HCL 5 MG/ML IV SOLN
20.0000 mg | Freq: Once | INTRAVENOUS | Status: AC
  Administered 2020-12-04: 20 mg via INTRAVENOUS
  Filled 2020-12-04: qty 4

## 2020-12-04 MED ORDER — SIMETHICONE 80 MG PO CHEW
80.0000 mg | CHEWABLE_TABLET | ORAL | Status: DC | PRN
  Filled 2020-12-04: qty 1

## 2020-12-04 MED ORDER — IBUPROFEN 600 MG PO TABS
600.0000 mg | ORAL_TABLET | Freq: Four times a day (QID) | ORAL | Status: DC
  Administered 2020-12-04 – 2020-12-06 (×8): 600 mg via ORAL
  Filled 2020-12-04 (×8): qty 1

## 2020-12-04 MED ORDER — SENNOSIDES-DOCUSATE SODIUM 8.6-50 MG PO TABS
2.0000 | ORAL_TABLET | Freq: Every day | ORAL | Status: DC
  Administered 2020-12-06: 2 via ORAL
  Filled 2020-12-04 (×2): qty 2

## 2020-12-04 MED ORDER — OXYTOCIN BOLUS FROM INFUSION
333.0000 mL | Freq: Once | INTRAVENOUS | Status: DC
  Administered 2020-12-04: 333 mL via INTRAVENOUS

## 2020-12-04 MED ORDER — FENTANYL-BUPIVACAINE-NACL 0.5-0.125-0.9 MG/250ML-% EP SOLN
12.0000 mL/h | EPIDURAL | Status: DC | PRN
  Filled 2020-12-04: qty 250

## 2020-12-04 MED ORDER — HYDRALAZINE HCL 20 MG/ML IJ SOLN: 10.0000 mg | INTRAMUSCULAR | Status: DC | PRN

## 2020-12-04 MED ORDER — LACTATED RINGERS IV SOLN: INTRAVENOUS | Status: DC

## 2020-12-04 NOTE — Anesthesia Procedure Notes (Signed)
Epidural Patient location during procedure: OB Start time: 12/04/2020 1:32 PM End time: 12/04/2020 1:45 PM  Staffing Anesthesiologist: Lannie Fields, DO Performed: anesthesiologist   Preanesthetic Checklist Completed: patient identified, IV checked, risks and benefits discussed, monitors and equipment checked, pre-op evaluation and timeout performed  Epidural Patient position: sitting Prep: DuraPrep and site prepped and draped Patient monitoring: continuous pulse ox, blood pressure, heart rate and cardiac monitor Approach: midline Location: L3-L4 Injection technique: LOR air  Needle:  Needle type: Tuohy  Needle gauge: 17 G Needle length: 9 cm Needle insertion depth: 6.5 cm Catheter type: closed end flexible Catheter size: 19 Gauge Catheter at skin depth: 12 cm Test dose: negative  Assessment Sensory level: T8 Events: blood not aspirated, injection not painful, no injection resistance, no paresthesia and negative IV test  Additional Notes Patient identified. Risks/Benefits/Options discussed with patient including but not limited to bleeding, infection, nerve damage, paralysis, failed block, incomplete pain control, headache, blood pressure changes, nausea, vomiting, reactions to medication both or allergic, itching and postpartum back pain. Confirmed with bedside nurse the patient's most recent platelet count. Confirmed with patient that they are not currently taking any anticoagulation, have any bleeding history or any family history of bleeding disorders. Patient expressed understanding and wished to proceed. All questions were answered. Sterile technique was used throughout the entire procedure. Please see nursing notes for vital signs. Test dose was given through epidural catheter and negative prior to continuing to dose epidural or start infusion. Warning signs of high block given to the patient including shortness of breath, tingling/numbness in hands, complete motor  block, or any concerning symptoms with instructions to call for help. Patient was given instructions on fall risk and not to get out of bed. All questions and concerns addressed with instructions to call with any issues or inadequate analgesia.  Reason for block:procedure for pain

## 2020-12-04 NOTE — Progress Notes (Signed)
Nina Rodriguez is a 40 y.o. H2C9470 at [redacted]w[redacted]d by LMP admitted for active labor, gestational HTN  Subjective: No chief complaint on file.  Notes ctx Objective: BP (!) 156/108   Pulse 75   Temp 97.9 F (36.6 C) (Oral)   Resp 16   Ht 5\' 6"  (1.676 m)   Wt 98.4 kg   LMP 03/28/2020   BMI 35.02 kg/m  No intake/output data recorded. No intake/output data recorded.  FHT: A:  FHR: 130 bpm, variability: moderate,  accelerations:  Present,  decelerations:  Absent FHT: B. FHR 135 (+) accel  no decel UC:   irregular, every 2-4 minutes SVE:   5-6 cm dilated, 60 effaced, -2 station Tracing: cat 1 x 2  Labs: Lab Results  Component Value Date   WBC 7.2 12/04/2020   HGB 11.6 (L) 12/04/2020   HCT 32.7 (L) 12/04/2020   MCV 82.8 12/04/2020   PLT 212 12/04/2020    Assessment / Plan: Augmentation of labor, progressing well Gestational HTN. No evidence of preeclampsia  Class a1 GDM Twins at 35 5/7 weeks P) epidural. Cont pitocin. Defer amniotomy   Anticipated MOD:  NSVD  Nina Rodriguez 12/04/2020, 1:10 PM

## 2020-12-04 NOTE — H&P (Addendum)
Nina Rodriguez is a 40 y.o. female presenting  @  84 5/7 weeks mono-di twin gestation for admission due to gestational HTN and  labor.. Pt notes ctx since this am. Pt has been followed by MFM. Last sono 8/8: 5lb 9 oz/5lb 6 oz.  BPP 8/8 x 2. Delivery at 36 wk recommended OB History     Gravida  5   Para  2   Term  2   Preterm      AB  2   Living  2      SAB  1   IAB  1   Ectopic      Multiple      Live Births  2          Past Medical History:  Diagnosis Date   Active labor (plan water labor/birth) 03/07/2011   Sullivan Lone syndrome    Medical history non-contributory    Normal breast feeding    Normal labor 03/31/2013   Normal labor 03/31/2013   Postpartum care following vaginal delivery (12/4) 03/31/2013   Postpartum care following vaginal delivery (water birth 11/9/) 03/07/2011   Past Surgical History:  Procedure Laterality Date   MOUTH SURGERY     age 78   NO PAST SURGERIES     Family History: family history includes Diabetes in her paternal grandfather, paternal grandmother, paternal uncle, and sister; Hypertension in her father, maternal grandfather, maternal grandmother, mother, paternal grandfather, paternal grandmother, and sister. Social History:  reports that she has never smoked. She has never used smokeless tobacco. She reports that she does not drink alcohol and does not use drugs.     Maternal Diabetes: Yes:  Diabetes Type:  Diet controlled Genetic Screening: Normal Maternal Ultrasounds/Referrals: Normal Fetal Ultrasounds or other Referrals:  Fetal echo, Referred to Materal Fetal Medicine  nl echo x 2 Maternal Substance Abuse:  No Significant Maternal Medications:  None Significant Maternal Lab Results:  Group B Strep negative Other Comments:   Hgb C trait, mono-di twins  Review of Systems  All other systems reviewed and are negative. History Dilation: 5.5 Effacement (%): 60 Station: -2 Exam by:: Nina Rodriguez, RNC Blood pressure (!)  156/108, pulse 75, temperature 97.9 F (36.6 C), temperature source Oral, resp. rate 16, height 5\' 6"  (1.676 m), weight 98.4 kg, last menstrual period 03/28/2020, currently breastfeeding. Maternal Exam:  Introitus: Normal vulva.  Physical Exam Constitutional:      Appearance: Normal appearance.  HENT:     Head: Atraumatic.  Eyes:     Extraocular Movements: Extraocular movements intact.  Cardiovascular:     Rate and Rhythm: Regular rhythm.     Heart sounds: Normal heart sounds.  Pulmonary:     Breath sounds: Normal breath sounds.  Abdominal:     Palpations: Abdomen is soft.  Genitourinary:    General: Normal vulva.  Musculoskeletal:        General: Normal range of motion.     Cervical back: Neck supple.  Neurological:     General: No focal deficit present.     Mental Status: She is oriented to person, place, and time.  Psychiatric:        Mood and Affect: Mood normal.        Behavior: Behavior normal.    Prenatal labs: ABO, Rh: --/--/A POS (08/09 1040) Antibody: NEG (08/09 1040) Rubella:  Immune RPR: NON REACTIVE (08/09 1035)  HBsAg:   neg HIV:   neg GBS:   negative hCV: neg Assessment/Plan: Labor Gestational  HTN Class A1 GDM Mono-Di twin gestation IUP @ 35 5/7 wk P) admit PIH labs. Pitocin augmentation. Epidural. CBG q 4hrs  Krishiv Sandler A Abie Killian 12/04/2020, 1:09 PM  Addendum CMP Latest Ref Rng & Units 12/04/2020 02/28/2008  Glucose 70 - 99 mg/dL 77 96  BUN 6 - 20 mg/dL 5(L) 6  Creatinine 2.35 - 1.00 mg/dL 5.73 2.20  Sodium 254 - 145 mmol/L 134(L) 139  Potassium 3.5 - 5.1 mmol/L 3.6 4.0  Chloride 98 - 111 mmol/L 106 107  CO2 22 - 32 mmol/L 17(L) 27  Calcium 8.9 - 10.3 mg/dL 2.7(C) 9.0  Total Protein 6.5 - 8.1 g/dL 6.2(B) -  Total Bilirubin 0.3 - 1.2 mg/dL 7.6(E) -  Alkaline Phos 38 - 126 U/L 99 -  AST 15 - 41 U/L 22 -  ALT 0 - 44 U/L 22 -    CBC Latest Ref Rng & Units 12/04/2020 04/01/2013 03/31/2013  WBC 4.0 - 10.5 K/uL 7.2 16.7(H) 6.9  Hemoglobin 12.0 -  15.0 g/dL 11.6(L) 11.5(L) 11.5(L)  Hematocrit 36.0 - 46.0 % 32.7(L) 32.1(L) 31.9(L)  Platelets 150 - 400 K/uL 212 216 207   PCR 0.12

## 2020-12-04 NOTE — Lactation Note (Signed)
This note was copied from a baby's chart. Lactation Consultation Note  Patient Name: Nina Rodriguez Today's Date: 12/04/2020 Reason for consult: L&D Initial assessment;Mother's request;Late-preterm 34-36.6wks;Multiple gestation;Other (Comment) Sullivan Lone Syndrome) Age:40 hours Baby B LC reviewed with parents feeding based on cues 8-12x in 24 hr period no more than 3 hrs without an attempt.   We were able to latch both infants in football. Infants intermittent suck swallows with increase in depth with breast compression.   Mom requesting DEBP once on the floor. She would like to start pumping with dEBP and offering her breast milk after latching as supplementation.   All questions answered at the end of the visit. Mom to receive further LC support on the floor.    Maternal Data Has patient been taught Hand Expression?: Yes Does the patient have breastfeeding experience prior to this delivery?: Yes How long did the patient breastfeed?: 6 and 9 months  Feeding Mother's Current Feeding Choice: Breast Milk  LATCH Score Latch: Repeated attempts needed to sustain latch, nipple held in mouth throughout feeding, stimulation needed to elicit sucking reflex.  Audible Swallowing: A few with stimulation  Type of Nipple: Everted at rest and after stimulation  Comfort (Breast/Nipple): Soft / non-tender  Hold (Positioning): Assistance needed to correctly position infant at breast and maintain latch.  LATCH Score: 7   Lactation Tools Discussed/Used    Interventions Interventions: Breast feeding basics reviewed;Breast compression;Assisted with latch;Adjust position;Skin to skin;Support pillows;Breast massage;Hand express;Expressed milk;Education  Discharge Pump: Manual (Mom to get electric pump through her insurance.)  Consult Status Consult Status: Follow-up Date: 12/05/20 Follow-up type: In-patient    Zedric Deroy  Nicholson-Springer 12/04/2020, 9:05 PM

## 2020-12-04 NOTE — Progress Notes (Signed)
S:  Epidural Denies ctx  O: BP 134/85   Pulse 71   Temp 98.6 F (37 C) (Oral)   Resp 16   Ht 5\' 6"  (1.676 m)   Wt 98.4 kg   LMP 03/28/2020   SpO2 99%   BMI 35.02 kg/m  Pitocin 12 miu VE 5/60/-3  bulging membrane  Tracing: (A) baseline 130 (B)  baseline 135 Ctx q 2 -4 mins  IMP: gest HTN  Twin gestation  Arrest of dilation due to suboptimal labor P) left  reverse cowgirl position. IVF bolus. Cont pitocin. Start labetalol Defer amniotomy

## 2020-12-04 NOTE — Lactation Note (Signed)
This note was copied from a baby's chart. Lactation Consultation Note  Patient Name: Nina Rodriguez Today's Date: 12/04/2020 Reason for consult: L&D Initial assessment;Late-preterm 34-36.6wks;Multiple gestation;Other (Comment) Sullivan Lone Syndrome) Age: <1 hr Baby A LC reviewed with parents feeding based on cues 8-12x in 24 hr period no more than 3 hrs without an attempt.   We were able to latch both infants in football. Infants intermittent suck swallows with increase in depth with breast compression.   Mom requesting DEBP once on the floor. She would like to start pumping with dEBP and offering her breast milk after latching as supplementation.   All questions answered at the end of the visit. Mom to receive further LC support on the floor.    Maternal Data Has patient been taught Hand Expression?: Yes Does the patient have breastfeeding experience prior to this delivery?: Yes How long did the patient breastfeed?: 2 previous children breast and bottle fed for 6 and 9 months  Feeding Mother's Current Feeding Choice: Breast Milk  LATCH Score Latch: Repeated attempts needed to sustain latch, nipple held in mouth throughout feeding, stimulation needed to elicit sucking reflex.  Audible Swallowing: A few with stimulation  Type of Nipple: Everted at rest and after stimulation  Comfort (Breast/Nipple): Soft / non-tender  Hold (Positioning): Assistance needed to correctly position infant at breast and maintain latch.  LATCH Score: 7   Lactation Tools Discussed/Used    Interventions Interventions: Breast feeding basics reviewed;Breast compression;Assisted with latch;Adjust position;Skin to skin;Support pillows;Breast massage;Expressed milk;Hand express;Education  Discharge Pump: Manual (Mom to get electric pump through her health insurance)  Consult Status Consult Status: Follow-up Date: 12/05/20 Follow-up type: In-patient    Nakea Gouger  Nicholson-Springer 12/04/2020, 8:58  PM

## 2020-12-04 NOTE — Anesthesia Preprocedure Evaluation (Addendum)
Anesthesia Evaluation  Patient identified by MRN, date of birth, ID band Patient awake    Reviewed: Allergy & Precautions, Patient's Chart, lab work & pertinent test results  Airway Mallampati: II  TM Distance: >3 FB Neck ROM: Full    Dental no notable dental hx. (+) Teeth Intact, Dental Advisory Given   Pulmonary neg pulmonary ROS,    Pulmonary exam normal breath sounds clear to auscultation       Cardiovascular hypertension (gest HTN), Normal cardiovascular exam Rhythm:Regular Rate:Normal     Neuro/Psych negative neurological ROS  negative psych ROS   GI/Hepatic negative GI ROS, Gilbert syn   Endo/Other  Obesity BMI 35  Renal/GU negative Renal ROS  negative genitourinary   Musculoskeletal negative musculoskeletal ROS (+)   Abdominal (+) + obese (BMI 35.02),   Peds negative pediatric ROS (+)  Hematology  (+) Blood dyscrasia, anemia , hct 32.7, plt 212   Anesthesia Other Findings   Reproductive/Obstetrics (+) Pregnancy 3rd pregnancy, twins Has not had epidural in past                            Anesthesia Physical Anesthesia Plan  ASA: 3  Anesthesia Plan: Epidural   Post-op Pain Management:    Induction:   PONV Risk Score and Plan:   Airway Management Planned:   Additional Equipment:   Intra-op Plan:   Post-operative Plan:   Informed Consent: I have reviewed the patients History and Physical, chart, labs and discussed the procedure including the risks, benefits and alternatives for the proposed anesthesia with the patient or authorized representative who has indicated his/her understanding and acceptance.       Plan Discussed with:   Anesthesia Plan Comments:         Anesthesia Quick Evaluation

## 2020-12-05 LAB — COMPREHENSIVE METABOLIC PANEL
ALT: 20 U/L (ref 0–44)
AST: 23 U/L (ref 15–41)
Albumin: 1.9 g/dL — ABNORMAL LOW (ref 3.5–5.0)
Alkaline Phosphatase: 88 U/L (ref 38–126)
Anion gap: 7 (ref 5–15)
BUN: 5 mg/dL — ABNORMAL LOW (ref 6–20)
CO2: 21 mmol/L — ABNORMAL LOW (ref 22–32)
Calcium: 8.6 mg/dL — ABNORMAL LOW (ref 8.9–10.3)
Chloride: 105 mmol/L (ref 98–111)
Creatinine, Ser: 0.6 mg/dL (ref 0.44–1.00)
GFR, Estimated: >60 mL/min (ref >60)
Glucose, Bld: 145 mg/dL — ABNORMAL HIGH (ref 70–99)
Potassium: 3.2 mmol/L — ABNORMAL LOW (ref 3.5–5.1)
Sodium: 133 mmol/L — ABNORMAL LOW (ref 135–145)
Total Bilirubin: 1.6 mg/dL — ABNORMAL HIGH (ref 0.3–1.2)
Total Protein: 4.9 g/dL — ABNORMAL LOW (ref 6.5–8.1)

## 2020-12-05 LAB — CBC
HCT: 27.7 % — ABNORMAL LOW (ref 36.0–46.0)
Hemoglobin: 10.2 g/dL — ABNORMAL LOW (ref 12.0–15.0)
MCH: 30.3 pg (ref 26.0–34.0)
MCHC: 36.8 g/dL — ABNORMAL HIGH (ref 30.0–36.0)
MCV: 82.2 fL (ref 80.0–100.0)
Platelets: 166 10*3/uL (ref 150–400)
RBC: 3.37 MIL/uL — ABNORMAL LOW (ref 3.87–5.11)
RDW: 13.4 % (ref 11.5–15.5)
WBC: 7.5 10*3/uL (ref 4.0–10.5)
nRBC: 0 % (ref 0.0–0.2)

## 2020-12-05 LAB — URIC ACID: Uric Acid, Serum: 4.9 mg/dL (ref 2.5–7.1)

## 2020-12-05 NOTE — Lactation Note (Signed)
This note was copied from a baby's chart. Lactation Consultation Note  Patient Name: Nina Rodriguez Today's Date: 12/05/2020 Reason for consult: Initial assessment;Late-preterm 34-36.6wks;Infant < 6lbs;Multiple gestation Age:40 hours   P4 mother whose infant twin girls are now 71 hours old.  These are later preterm infants born at 35+5 weeks with a CGA of 35+6 weeks weighing < 5 lbs.  Mother breast fed her other children (now 56 and 50 years old) for 6-9 months each.  Mother's feeding preference is breast/formula.  Arrived to room and mother had Baby B ("[place]" )  latched in the football hold.  She had a shallow latch.  Released baby from the breast and helped mother obtain a deeper latch; mother denied pain and observed baby feeding for 3 minutes.  Taught parents paced bottle feeding and father supplemented with 9 mls of Similac 22 calorie formula using the purple extra slow flow nipple.  Baby paced well and burped.  Baby A ("Layla") breast fed in the football hold for 9 minutes.  She obtained a deep latch and required stimulation to continue feeding.  Observed a wide gape and flanged lips.  Father provided 6 mls of Similac 22 calorie formula using the purple extra slow flow nipple.  Baby required cheek/jaw support to feed but tolerated without difficulty.  Burped well after feeding.  Observed mother using the DEBP and reviewed feeding plan for the day.  Mother will breast feed every three hours or sooner if babies desire.  Father will provided the supplementation while mother pumps for 15 minutes.  #24 flange size is appropriate, however, I anticipate mother will need to use a #27 on the left breast before long.  Educated mother on correct flange size.  Coconut oil provided for nipple comfort and flange lubrication.  Mother collected a few drops at the end of her pumping session which she used on her nipples and a drop to each baby.  Reviewed the LPTI policy and encouraged mother to call for any  questions or feeding difficulties.  Mother does not have her DEBP yet.  Suggested she contact her insurance company this morning to see if they can expedite the shipping of her DEBP due to the urgent need to have a DEBP at discharge.  Mother will follow through with this.  RN in room at end of visit and updated.   Maternal Data Has patient been taught Hand Expression?: Yes Does the patient have breastfeeding experience prior to this delivery?: Yes How long did the patient breastfeed?: 6-9 months with her other two children  Feeding Mother's Current Feeding Choice: Breast Milk and Formula Nipple Type: Extra Slow Flow  LATCH Score Latch: Repeated attempts needed to sustain latch, nipple held in mouth throughout feeding, stimulation needed to elicit sucking reflex.  Audible Swallowing: None  Type of Nipple: Everted at rest and after stimulation  Comfort (Breast/Nipple): Soft / non-tender  Hold (Positioning): Assistance needed to correctly position infant at breast and maintain latch.  LATCH Score: 6   Lactation Tools Discussed/Used Tools: Pump;Flanges;Coconut oil Flange Size: 24 Breast pump type: Double-Electric Breast Pump;Manual Pump Education: Setup, frequency, and cleaning (Reviewed) Reason for Pumping: Breast stimulation and supplementation for LPTI Pumping frequency: Every three hours  Interventions Interventions: Breast feeding basics reviewed;Assisted with latch;Skin to skin;Breast massage;Hand express;Breast compression;Adjust position;DEBP;Hand pump;Coconut oil;Expressed milk;Position options;Support pillows;Education  Discharge Pump: DEBP;Manual;Personal (Will call insurance company for pump) WIC Program: No  Consult Status Consult Status: Follow-up Date: 12/06/20 Follow-up type: In-patient    Akeia Perot  R Alfredia Desanctis 12/05/2020, 5:16 AM

## 2020-12-05 NOTE — Progress Notes (Signed)
PPD1 SVD:   S:  Pt reports feeling pain in  bottom/ Tolerating po/ Voiding without problems/ No n/v/ Bleeding is light/ Pain controlled withprescription NSAID's including motrin  Newborn info twins: both girl, breast/bottle feed   O:  A & O x 3 1/ VS: Blood pressure (!) 148/89, pulse 84, temperature 98 F (36.7 C), temperature source Oral, resp. rate 16, height 5\' 6"  (1.676 m), weight 98.4 kg, last menstrual period 03/28/2020, SpO2 98 %, unknown if currently breastfeeding.  LABS:  Results for orders placed or performed during the hospital encounter of 12/04/20 (from the past 24 hour(s))  Uric acid     Status: None   Collection Time: 12/05/20 12:32 AM  Result Value Ref Range   Uric Acid, Serum 4.9 2.5 - 7.1 mg/dL  CBC     Status: Abnormal   Collection Time: 12/05/20  5:45 AM  Result Value Ref Range   WBC 7.5 4.0 - 10.5 K/uL   RBC 3.37 (L) 3.87 - 5.11 MIL/uL   Hemoglobin 10.2 (L) 12.0 - 15.0 g/dL   HCT 02/04/21 (L) 63.1 - 49.7 %   MCV 82.2 80.0 - 100.0 fL   MCH 30.3 26.0 - 34.0 pg   MCHC 36.8 (H) 30.0 - 36.0 g/dL   RDW 02.6 37.8 - 58.8 %   Platelets 166 150 - 400 K/uL   nRBC 0.0 0.0 - 0.2 %  Comprehensive metabolic panel     Status: Abnormal   Collection Time: 12/05/20  5:45 AM  Result Value Ref Range   Sodium 133 (L) 135 - 145 mmol/L   Potassium 3.2 (L) 3.5 - 5.1 mmol/L   Chloride 105 98 - 111 mmol/L   CO2 21 (L) 22 - 32 mmol/L   Glucose, Bld 145 (H) 70 - 99 mg/dL   BUN 5 (L) 6 - 20 mg/dL   Creatinine, Ser 02/04/21 0.44 - 1.00 mg/dL   Calcium 8.6 (L) 8.9 - 10.3 mg/dL   Total Protein 4.9 (L) 6.5 - 8.1 g/dL   Albumin 1.9 (L) 3.5 - 5.0 g/dL   AST 23 15 - 41 U/L   ALT 20 0 - 44 U/L   Alkaline Phosphatase 88 38 - 126 U/L   Total Bilirubin 1.6 (H) 0.3 - 1.2 mg/dL   GFR, Estimated 7.74 >12 mL/min   Anion gap 7 5 - 15    I&O: I/O last 3 completed shifts: In: -  Out: 525 [Urine:325; Blood:200]   No intake/output data recorded.  Lungs: chest clear, no wheezing, rales, normal symmetric  air entry  Heart: regular rate and rhythm, S1, S2 normal, no murmur, click, rub or gallop  Abdomen: soft uterus firm@ umb  Perineum: is normal  Lochia: light  Extremities:no redness or tenderness in the calves or thighs, edema tr+    A/P: PPD # 1/ >87 Hypokalemia Gestational hypertension on labetalol  Doing well  Continue routine post partum orders  Anticipate d/c home in am

## 2020-12-05 NOTE — Anesthesia Postprocedure Evaluation (Signed)
Anesthesia Post Note  Patient: Nina Rodriguez  Procedure(s) Performed: AN AD HOC LABOR EPIDURAL     Anesthetic complications: no   No notable events documented.  Last Vitals:  Vitals:   12/04/20 2330 12/05/20 0330  BP: (!) 143/89 137/84  Pulse: 69 97  Resp: 18 18  Temp: 36.7 C 36.8 C  SpO2: 98% 98%    Last Pain:  Vitals:   12/05/20 0330  TempSrc: Oral  PainSc:    Pain Goal: Patients Stated Pain Goal: 4 (12/04/20 1037)                 Cephus Shelling

## 2020-12-06 LAB — SURGICAL PATHOLOGY

## 2020-12-06 MED ORDER — LABETALOL HCL 100 MG PO TABS: 100.0000 mg | ORAL_TABLET | Freq: Two times a day (BID) | ORAL | 6 refills | Status: DC

## 2020-12-06 MED ORDER — IBUPROFEN 600 MG PO TABS: 600.0000 mg | ORAL_TABLET | Freq: Four times a day (QID) | ORAL | 0 refills | Status: AC

## 2020-12-06 NOTE — Discharge Summary (Signed)
Postpartum Discharge Summary  Date of Service updated     Patient Name: Nina Rodriguez DOB: 30-Jun-1980 MRN: [id number]  Date of admission: 12/04/2020 Delivery date:   Tanielle, Emigh Vennela [[id number]]  12/04/2020    Mylasia, Vorhees Tayler [[id number]]  12/04/2020  Delivering provider:    Anhelica, Fowers Canary [[id number]]  Shanyla Marconi, Aneira, Cavitt Bayler [[id number]]  Nadina Fomby, Alanda Slim  Date of discharge: 12/06/2020  Admitting diagnosis: Gestational hypertension [O13.9] Postpartum care following vaginal delivery [Z39.2] labor Intrauterine pregnancy: [redacted]w[redacted]d    Secondary diagnosis:  Active Problems:   Gestational hypertension  Additional problems: class A1 GDM, AMA    Discharge diagnosis: Preterm Pregnancy Delivered, Gestational Hypertension, GDM A1, and AMA                                               Post partum procedures: none Augmentation: Pitocin Complications: None  Hospital course: Onset of Labor With Vaginal Delivery      40y.o. yo [id number] 335w5das admitted in Active Labor on 12/04/2020. Patient had an uncomplicated labor course as follows:  Membrane Rupture Time/Date:    CaDanesha, Kirchoffpril [0[[id number]]7:47 PM    CaGelila, Wellpril [0[[id number]]7:47 PM ,   CaHalaina, Vanduzerpril [0[[id number]]12/04/2020    CaCoreen, Shippeepril [0[[id number]]12/04/2020   Delivery Method:   CaArdis Hughspril [0[[id number]]Vaginal, Spontaneous    CaRevecca, Nachtigalpril [0[[id number]]Vaginal, Spontaneous  Episiotomy:    CaAbriella, Filkinspril [0[[id number]]None    CaNaisha, Wisdompril [0[[id number]]None  Lacerations:     CaDarchelle, Nunespril [0[[id number]]None    CaLilyan, Pretepril [0[[id number]]None  Patient had an uncomplicated postpartum course.  She is ambulating, tolerating a regular diet, passing flatus, and urinating well. Patient is discharged home in stable condition on 12/06/20.  Newborn Data: Birth date:   CaTuyet, Baderpril [0[[id number]]12/04/2020     CaBrookie, Waymentpril [0[[id number]]12/04/2020  Birth time:   CaJulea, Huttopril [0[[id number]]7:49 PM    CaBryce, Kimblepril [0[[id number]]8:00 PM  Gender:   CaReine, Bristowpril [0[[id number]]Female    CaKabella, Cassidypril [0[[id number]]Female  Living status:   CaWylee, Ogdenpril [0[[id number]]Living    CaCory, Ramapril [0[[id number]]Living  Apgars:   CaGermaine, Ripppril [0[[id number]]  6  CaMakynli, Stillspril [0[[id number]]7 ,   CaDoyne, Ellingerpril [0[[id number]]  3  CaShayley, Medlinpril [0[[id number]]8  Weight:   CaStevey, Stapletonpril [0[[id number]]  7416    CaLakota, Schweppepril [0[[id number]]2206 g   Magnesium Sulfate received: No BMZ received: No Rhophylac:No MMR:No T-DaP:Given prenatally Flu: No Transfusion:No  Physical exam  Vitals:   12/05/20 0805 12/05/20 1209 12/05/20 2035 12/06/20 0539  BP: (!) 145/97 (!) 148/89 (!) 144/96 (!) 143/95  Pulse: 81 84 70 63  Resp: _0 Temp: 98 F (36.7 C)  97.8 F (36.6 C) 98 F (36.7 C)  TempSrc: Oral  Oral Oral  SpO2: 97% 98% 98% 99%  Weight:      Height:       General: alert, cooperative, and no distress Lochia: appropriate Uterine Fundus: firm Incision: N/A DVT  Evaluation: No evidence of DVT seen on physical exam. Labs: Lab Results  Component Value Date   WBC 7.5 12/05/2020   HGB 10.2 (L) 12/05/2020   HCT 27.7 (L) 12/05/2020   MCV 82.2 12/05/2020   PLT 166 12/05/2020   CMP Latest Ref Rng & Units 12/05/2020  Glucose 70 - 99 mg/dL 145(H)  BUN 6 - 20 mg/dL 5(L)  Creatinine 0.44 - 1.00 mg/dL 0.60  Sodium 135 - 145 mmol/L 133(L)  Potassium 3.5 - 5.1 mmol/L 3.2(L)  Chloride 98 - 111 mmol/L 105  CO2 22 - 32 mmol/L 21(L)  Calcium 8.9 - 10.3 mg/dL 8.6(L)  Total Protein 6.5 - 8.1 g/dL 4.9(L)  Total Bilirubin 0.3 - 1.2 mg/dL 1.6(H)  Alkaline Phos 38 - 126 U/L 88  AST 15 - 41 U/L 23  ALT 0 - 44 U/L 20   Edinburgh Score: Edinburgh Postnatal Depression Scale Screening Tool 12/05/2020  I have been  able to laugh and see the funny side of things. 0  I have looked forward with enjoyment to things. 0  I have blamed myself unnecessarily when things went wrong. 0  I have been anxious or worried for no good reason. 0  I have felt scared or panicky for no good reason. 0  Things have been getting on top of me. 0  I have been so unhappy that I have had difficulty sleeping. 0  I have felt sad or miserable. 0  I have been so unhappy that I have been crying. 0  The thought of harming myself has occurred to me. 0  Edinburgh Postnatal Depression Scale Total 0      After visit meds:  Allergies as of 12/06/2020   No Known Allergies      Medication List     TAKE these medications    [employer] PRENATAL GUMMY PO Take 2 tablets by mouth daily.   ibuprofen 600 MG tablet Commonly known as: ADVIL Take 1 tablet (600 mg total) by mouth every 6 (six) hours.   labetalol 100 MG tablet Commonly known as: NORMODYNE Take 1 tablet (100 mg total) by mouth 2 (two) times daily.         Discharge home in stable condition Infant Feeding: Bottle and Breast Infant Disposition:home with mother Discharge instruction: per After Visit Summary and Postpartum booklet. Activity: Advance as tolerated. Pelvic rest for 6 weeks.  Diet: routine diet Anticipated Birth Control:  plans vasectomy Postpartum Appointment:6 weeks Additional Postpartum F/U: 2 hour GTT and BP check 1 week, 8 wk PP glucose test Future Appointments:No future appointments. Follow up Visit:  Follow-up Information     Servando Salina, MD Follow up in 1 week(s).   Specialty: Obstetrics and Gynecology Why: BP check Contact information: [address] [place] [address] [phone]         Servando Salina, MD Follow up in 6 week(s).   Specialty: Obstetrics and Gynecology Contact information: [address] [place] [address] [phone]                      12/06/2020 Marvene Staff, MD

## 2020-12-06 NOTE — Discharge Instructions (Signed)
Call if temperature greater than equal to 100.4, nothing per vagina for 4-6 weeks or severe nausea vomiting, increased incisional pain , drainage or redness in the incision site, no straining with bowel movements, showers no bath °

## 2020-12-06 NOTE — Lactation Note (Addendum)
This note was copied from a baby's chart. Lactation Consultation Note  Patient Name: Nina Rodriguez Tena Today's Date: 12/06/2020 Reason for consult: Follow-up assessment;Mother's request;Late-preterm 34-36.6wks;Multiple gestation;Infant < 6lbs;Maternal endocrine disorder Age:40 hours  Mom following feeding plan established by SLP both Twin A and B. (15-28 ml ) Mom offering breast followed by supplementing with 22 cal/oz Neosure. Infant just fed prior to [facility] visit. Mom states only discomfort had with flange size, she increased from 24 to 27 and states feels better.  Mom pumped 2-3x today getting small amounts. Mom encouraged to use DEBP q 3 hrs for 15 min after latching to increase stimulation and maintain her milk supply.   Mom to call for assistance with latching if needed. Mom using coconut oil for nipple care. Mom declined the use of comfort gels for nipple pain at this time.   Mom following SLP guidelines keeping total feeding under 30 mins and increasing supplementation as tolerated.  LC returned to review signs, symptoms, treatment and prevention of engorgement.  All questions answered at the end of the visit.   Maternal Data    Feeding Mother's Current Feeding Choice: Breast Milk and Formula Nipple Type: Extra Slow Flow  LATCH Score                    Lactation Tools Discussed/Used Tools: Pump;Flanges;Coconut oil Flange Size: 27 Breast pump type: Double-Electric Breast Pump Pump Education: Setup, frequency, and cleaning;Milk Storage Reason for Pumping: increase stimulation Pumping frequency: every 3 hrs for 15 min  Interventions Interventions: Breast feeding basics reviewed;DEBP;Education;Coconut oil  Discharge    Consult Status Consult Status: Follow-up Date: 12/07/20 Follow-up type: In-patient    Nimai Burbach  Nicholson-Springer 12/06/2020, 4:42 PM

## 2020-12-17 ENCOUNTER — Telehealth (HOSPITAL_COMMUNITY): Payer: Self-pay | Admitting: *Deleted

## 2020-12-17 NOTE — Telephone Encounter (Signed)
Attempted hospital discharge follow-up call. RN left message for patient to return RN call. Deforest Hoyles, RN, 12/17/20, [phone].

## 2023-04-01 IMAGING — US US MFM OB DETAIL+14 WK
1 series · 14 of 28 positions shown · non-contrast
Comparison: none

[Series 1: us mfm ob detail+14 wk · 14 of 252 slices shown]
[im 10/252]
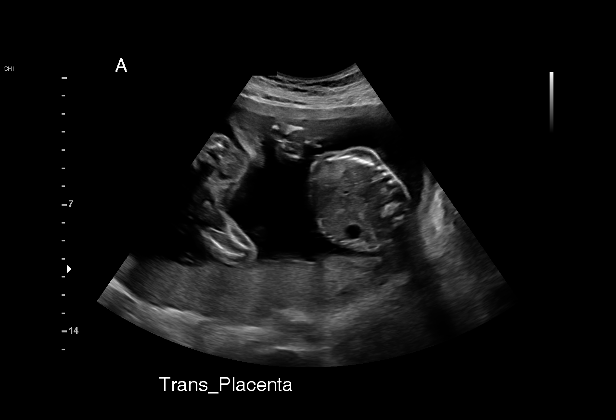
[im 28/252]
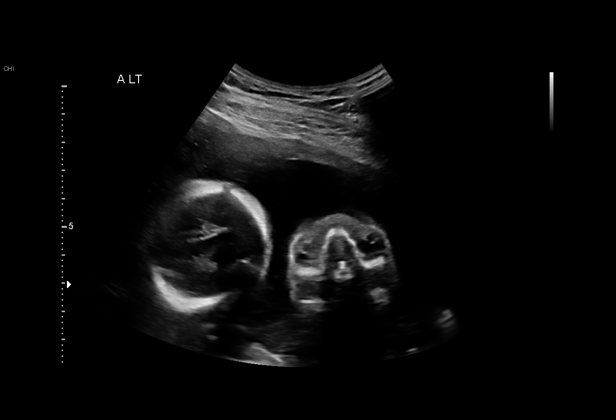
[im 47/252]
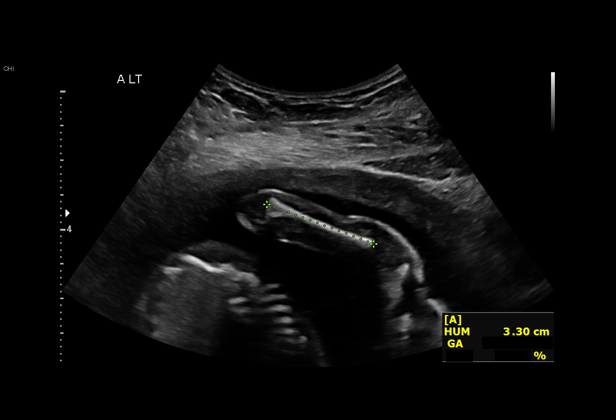
[im 66/252]
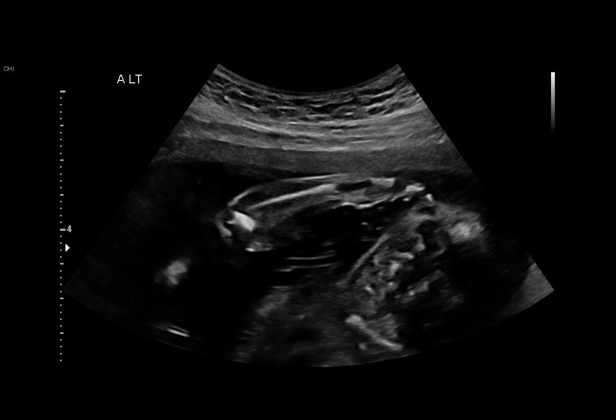
[im 84/252]
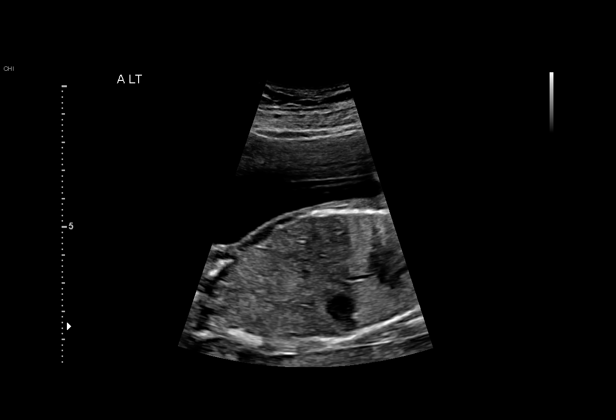
[im 103/252]
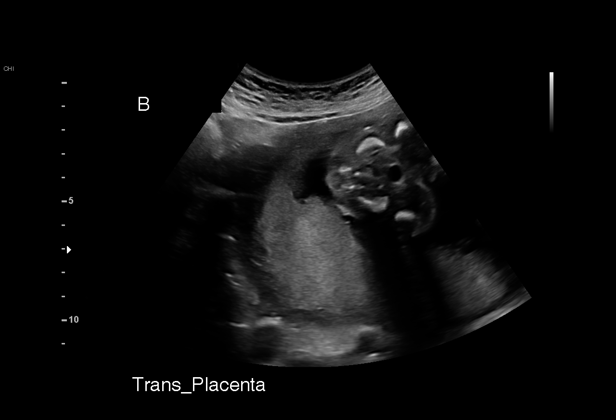
[im 121/252]
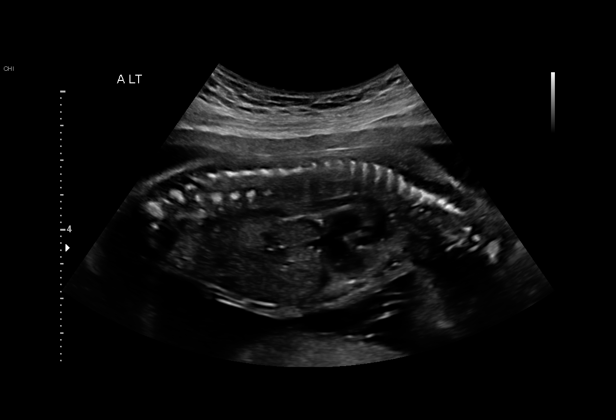
[im 140/252]
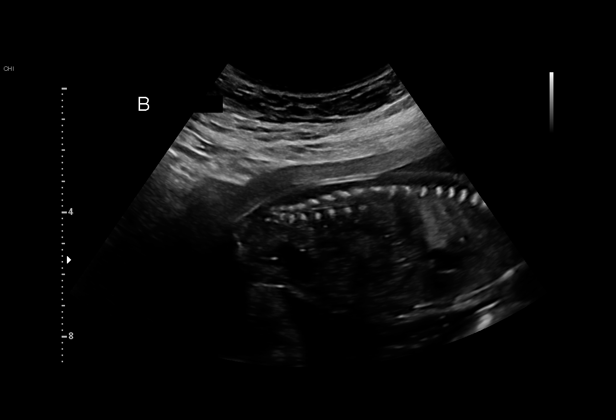
[im 159/252]
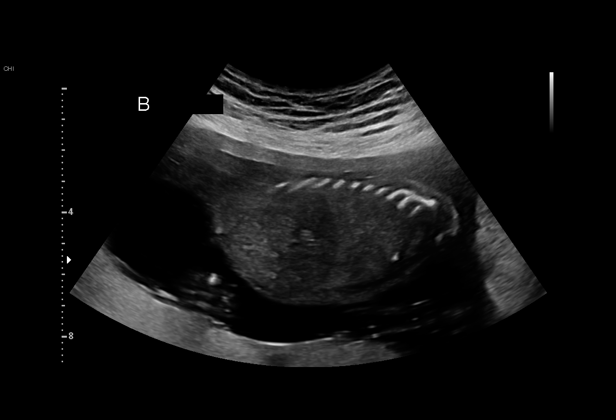
[im 177/252]
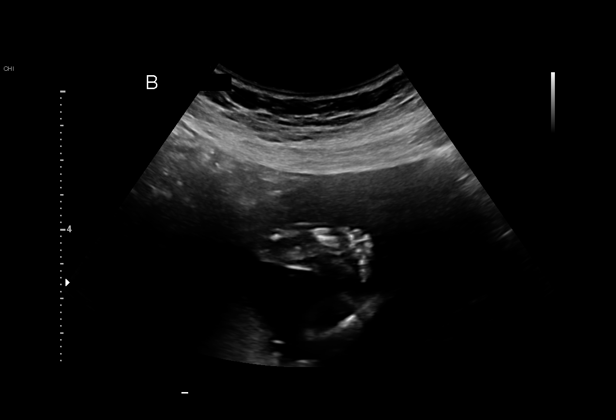
[im 196/252]
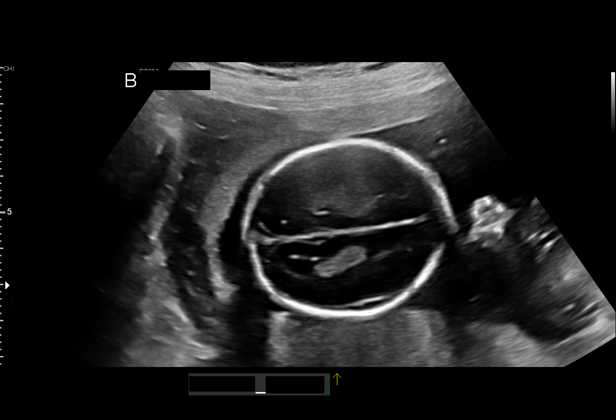
[im 214/252]
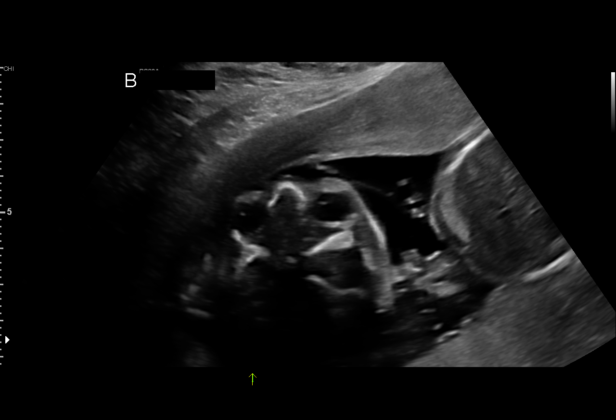
[im 233/252]
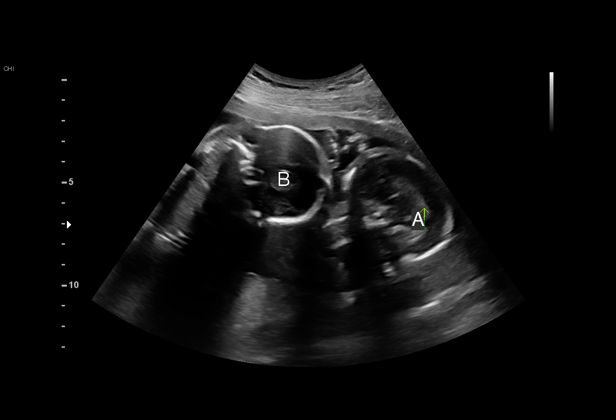
[im 252/252]
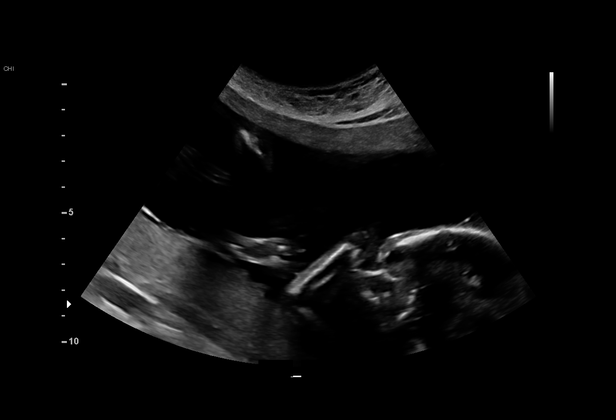

[14 of 28 positions shown; findings below may reference images not displayed]

OB/GYN

                                                      HYUEN KOOK
    +14 WK                                            HYUEN KOOK

Indications

 20 weeks gestation of pregnancy
 Advanced maternal age multigravida 40,
 second trimester
 Negative AFP
 Twin pregnancy, Shumane/Paulo, second trimester
 Fetal choroid plexus cyst (Twin A)
Fetal Evaluation (Fetus A)

 Num Of Fetuses:         2
 Fetal Heart Rate(bpm):  145
 Cardiac Activity:       Observed
 Fetal Lie:              Lower Left Fetus
 Presentation:           Cephalic
 Placenta:               Posterior
 P. Cord Insertion:      Visualized
 Membrane Desc:      Dividing Membrane seen - Monochorionic

 Amniotic Fluid
 AFI FV:      Within normal limits

                             Largest Pocket(cm)

Biometry (Fetus A)

 BPD:      50.4  mm     G. Age:  21w 2d         66  %    CI:        78.68   %    70 - 86
                                                         FL/HC:      19.3   %    15.9 -
 HC:      179.7  mm     G. Age:  20w 3d         22  %    HC/AC:      1.07        1.06 -
 AC:      167.5  mm     G. Age:  21w 5d         73  %    FL/BPD:     68.7   %
 FL:       34.6  mm     G. Age:  20w 6d         42  %    FL/AC:      20.7   %    20 - 24
 HUM:        33  mm     G. Age:  21w 1d         55  %
 CER:      21.3  mm     G. Age:  20w 1d         41  %

 LV:        4.6  mm
 CM:        2.7  mm

 Est. FW:     412  gm    0 lb 15 oz      68  %     FW Discordancy      0 \ 4 %
OB History

 Gravidity:    5         Term:   2        Prem:   0        SAB:   1
 TOP:          1       Ectopic:  0        Living: 2
Gestational Age (Fetus A)

 LMP:           21w 0d        Date:  03/28/20                 EDD:   01/02/21
 U/S Today:     21w 1d                                        EDD:   01/01/21
 Best:          20w 6d     Det. By:  Early Ultrasound         EDD:   01/03/21
                                     (05/09/20)
Anatomy (Fetus A)

 Cranium:               Appears normal         LVOT:                   Not well visualized
 Cavum:                 Appears normal         Aortic Arch:            Appears normal
 Ventricles:            Appears normal         Ductal Arch:            Not well visualized
 Choroid Plexus:        choroid plexus         Diaphragm:              Appears normal
                        cyst
 Cerebellum:            Appears normal         Stomach:                Appears normal, left
                                                                       sided
 Posterior Fossa:       Appears normal         Abdomen:                Appears normal
 Nuchal Fold:           Not applicable (>20    Abdominal Wall:         Appears nml (cord
                        wks GA)                                        insert, abd wall)
 Face:                  Orbits nl; profile not Cord Vessels:           Appears normal (3
                        well visualized                                vessel cord)
 Lips:                  Appears normal         Kidneys:                Appear normal
 Palate:                Not well visualized    Bladder:                Appears normal
 Thoracic:              Appears normal         Spine:                  Appears normal
 Heart:                 Appears normal         Upper Extremities:      RUE appears
                        (4CH, axis, and                                normal; Lt humerus
                        situs)
 RVOT:                  Not well visualized    Lower Extremities:      Appears normal

 Other:  Fetus appears to be female.Lenses visualized. Nasal bone visualized.
         feet visualized. 3VV and 3VT visualized. left radius/ulnar not visualized

Fetal Evaluation (Fetus B)

 Num Of Fetuses:         2
 Cardiac Activity:       Observed
 Fetal Lie:              Upper Fetus
 Presentation:           Variable
 Placenta:               Posterior
 P. Cord Insertion:      Visualized
 Membrane Desc:      Dividing Membrane seen - Monochorionic

 Amniotic Fluid
 AFI FV:      Within normal limits

                             Largest Pocket(cm)
                             5.
Biometry (Fetus B)

 BPD:      48.2  mm     G. Age:  20w 4d         37  %    CI:        78.97   %    70 - 86
                                                         FL/HC:      20.1   %    15.9 -
 HC:      171.5  mm     G. Age:  19w 5d          6  %    HC/AC:      1.04        1.06 -
 AC:      164.7  mm     G. Age:  21w 4d         65  %    FL/BPD:     71.6   %
 FL:       34.5  mm     G. Age:  20w 6d         41  %    FL/AC:      20.9   %    20 - 24
 HUM:      34.4  mm     G. Age:  21w 5d         71  %
 CER:      20.9  mm     G. Age:  20w 0d         31  %

 LV:        4.2  mm
 CM:        2.6  mm

 Est. FW:     395  gm    0 lb 14 oz      55  %     FW Discordancy         4  %
Gestational Age (Fetus B)

 LMP:           21w 0d        Date:  03/28/20                 EDD:   01/02/21
 U/S Today:     20w 5d                                        EDD:   01/04/21
 Best:          20w 6d     Det. By:  Early Ultrasound         EDD:   01/03/21
                                     (05/09/20)
Anatomy (Fetus B)

 Cranium:               Appears normal         LVOT:                   Appears normal
 Cavum:                 Appears normal         Aortic Arch:            Appears normal
 Ventricles:            Appears normal         Ductal Arch:            Appears normal
 Choroid Plexus:        Appears normal         Diaphragm:              Appears normal
 Cerebellum:            Appears normal         Stomach:                Appears normal, left
                                                                       sided
 Posterior Fossa:       Appears normal         Abdomen:                Echogenic Focus
                                                                       sup stomach
 Nuchal Fold:           Not applicable (>20    Abdominal Wall:         Appears nml (cord
                        wks GA)                                        insert, abd wall)
 Face:                  Appears normal         Cord Vessels:           Appears normal (3
                        (orbits and profile)                           vessel cord)
 Lips:                  Appears normal         Kidneys:                Appear normal
 Palate:                Not well visualized    Bladder:                Appears normal
 Thoracic:              Appears normal         Spine:                  Appears normal
 Heart:                 Appears normal         Upper Extremities:      Appears normal
                        (4CH, axis, and
                        situs)
 RVOT:                  Appears normal         Lower Extremities:      Appears normal

 Other:  Fetus appears to be female. Nasal bone visualized. Lenses
         visualized. Feet visualized. 3VV visualized.
Cervix Uterus Adnexa
 Cervix
 Length:           3.45  cm.
 Normal appearance by transabdominal scan.

 Uterus
 No abnormality visualized.

 Right Ovary
 Within normal limits.

 Left Ovary
 Within normal limits.

 Cul De Sac
 No free fluid seen.

 Adnexa
 No abnormality visualized.
Comments

 Ms. Glasper is a 40 yo G5P2 who is here for a detailed
 anatomy for suspected singleton pregnancy.

 She had a negative AFP and reports a normal cell free DNA.

 Today we observed monochorionic diamniotic twin pregnancy.
 Measurements for Twin A and B are consistent with dates,
 which was by a an early ultrasound.
 The amniotic fluid appears normal and concordinant in Twin
 A and B, in addition bladder and stomachs were equally
 present.
 Suboptimal views were observed in the anatomy review of
 Twin A and B, please see the above report for details. This
 was seen secondary to fetal position and multiple gestation.

 The potential risks associated with a monochorionic twin
 gestation were discussed.  This discussion included a review
 of the increased risk of miscarriages, anomalies, preterm
 labor, and/or delivery, malpresentation, C-section, gestational
 diabetes, and/or preeclampsia.  Additionally with respect to
 fetuses, there is an increased risk for fetal growth restriction
 of one or both twins, preterm labor, and associated morbidity,
 and intrauterine fetal demise.  We also discussed the risk of
 twin twin transfusion sysndrome (TTTS), which occurs in
 approximately 15% of monochorionic twin pregnancies. This
 condition is due to unequal placental vascular anastomoses
 between the fetuses. The initial presentation is in the form of
 oligohydramnios in the donor twin and polyhydramnios in the
 recipient twin. These changes can rapidly progress to include
 Doppler abnormalities and fetal death. No evidence of TTTS
 is seen today, however, surveillance every two weeks
 through the second trimester is indicated.

 In addition, monochorionic diamniotic twin pregnancy is
 associated with a 3-5% risk of congenital heart defect. As
 such, we recommend fetal echocardiogram be performed.

 Lastly, we advise serial growth sonograms every 3-4  weeks
 in the third trimester with the initiation of antenatal testing in
 the form weekly BPP at 32 weeks and delivery no later than
 37 weeks.

 I placed the referral for a fetal echocardiogram today and we
 have scheduled her to return in 3-4 weeks.

 I spent 30 minute speaking with Ms. Glasper in person and
 her husband was present telephonically with > 50% in face to
 face consultation.

## 2023-04-29 IMAGING — US US MFM OB LIMITED
1 series · 12 of 28 positions shown · non-contrast
Comparison: none

[Series 1: us mfm ob limited · 12 of 54 slices shown]
[im 2/54]
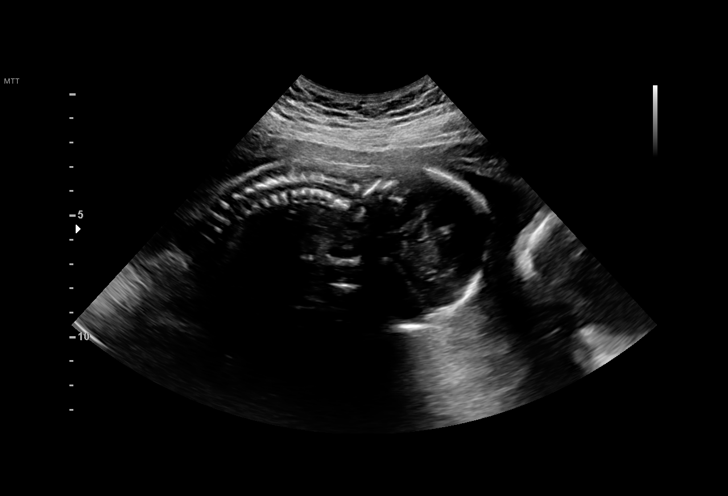
[im 6/54]
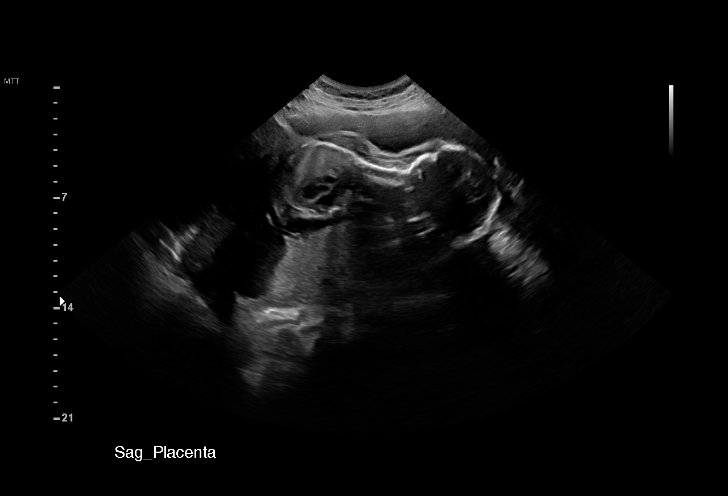
[im 10/54]
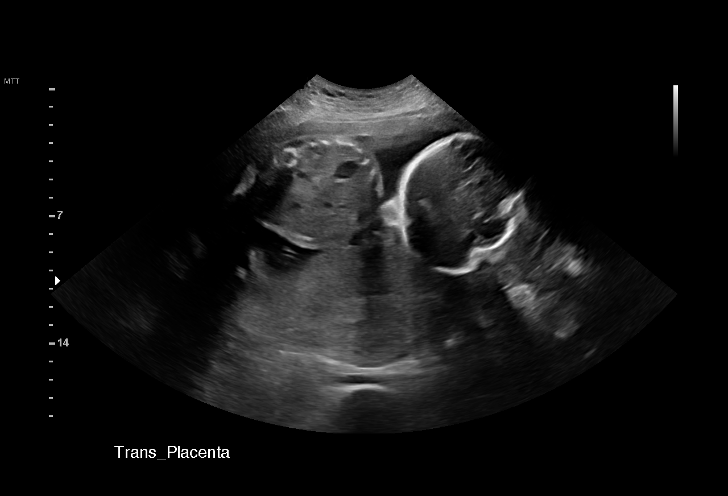
[im 16/54]
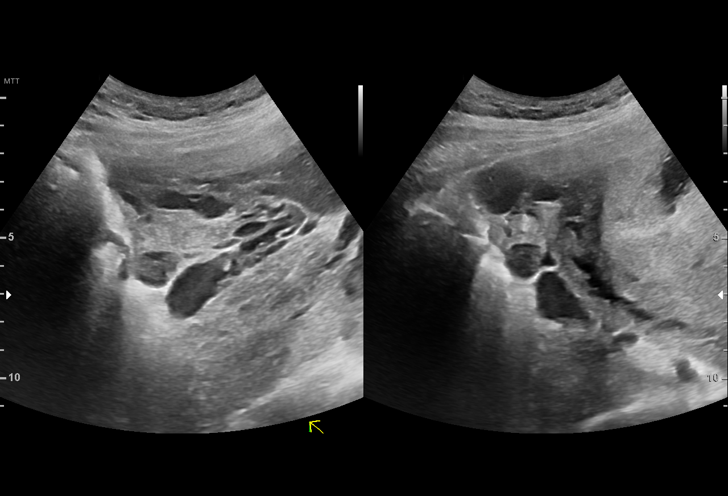
[im 20/54]
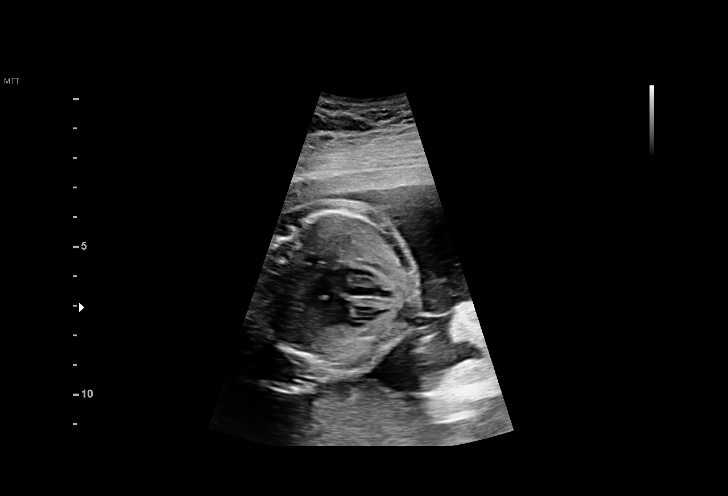
[im 24/54]
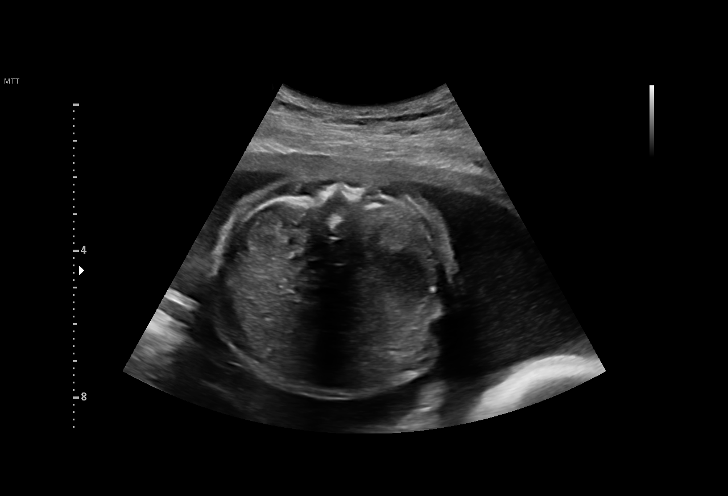
[im 30/54]
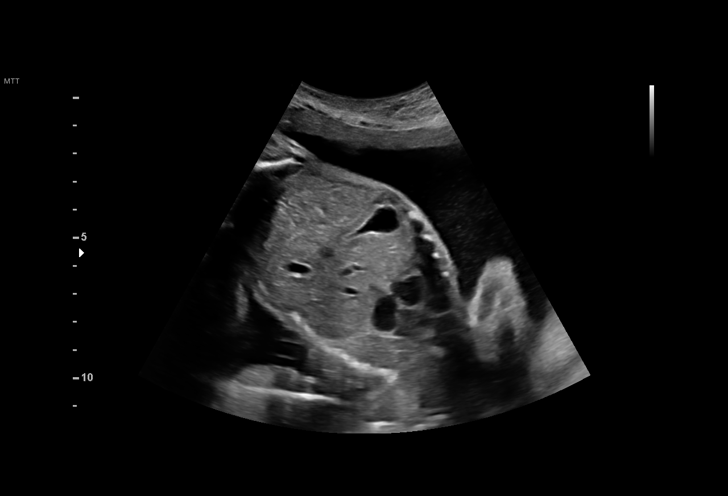
[im 34/54]
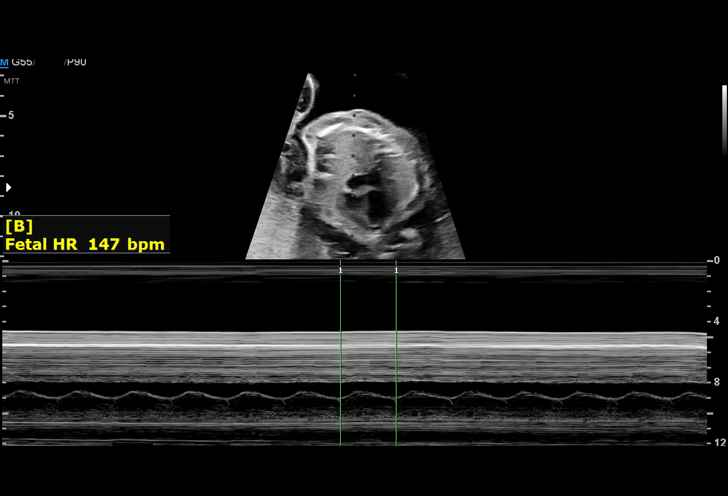
[im 38/54]
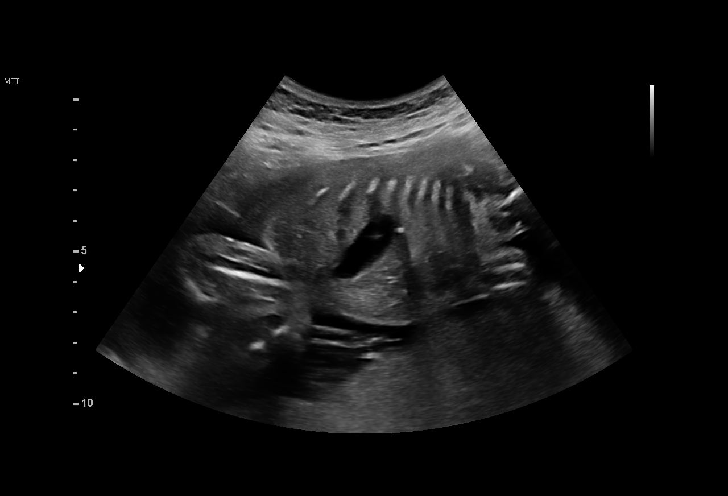
[im 44/54]
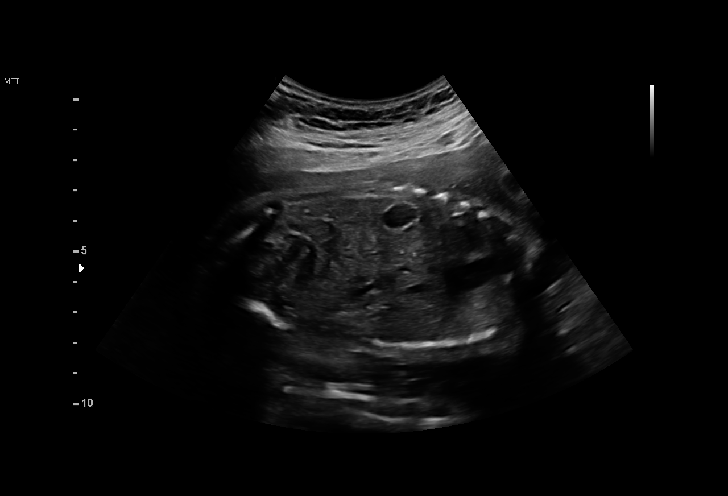
[im 48/54]
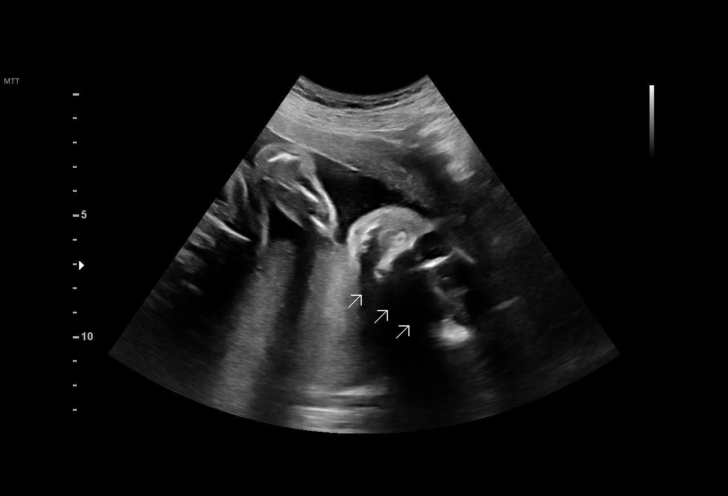
[im 52/54]
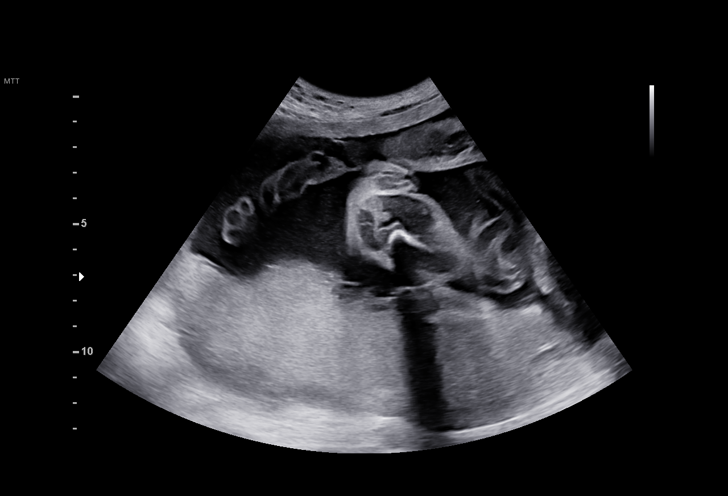

[12 of 28 positions shown; findings below may reference images not displayed]

OB/GYN

Indications

 Twin pregnancy, Idris/Juani, second trimester
 24 weeks gestation of pregnancy
 Advanced maternal age multigravida 40,
 second trimester
 Negative AFP
 Fetal choroid plexus cyst (Twin A)
Fetal Evaluation (Fetus A)

 Num Of Fetuses:         2
 Fetal Heart Rate(bpm):  153
 Cardiac Activity:       Observed
 Fetal Lie:              Maternal right side/Lower
 Presentation:           Cephalic
 Placenta:               Posterior
 P. Cord Insertion:      Previously Visualized
 Membrane Desc:      Dividing Membrane seen - Monochorionic

 Amniotic Fluid
 AFI FV:      Within normal limits

                             Largest Pocket(cm)

Biometry (Fetus A)
 LV:        3.9  mm
OB History

 Gravidity:    5         Term:   2        Prem:   0        SAB:   1
 TOP:          1       Ectopic:  0        Living: 2
Gestational Age (Fetus A)

 LMP:           25w 0d        Date:  03/28/20                 EDD:   01/02/21
 Best:          24w 6d     Det. By:  Early Ultrasound         EDD:   01/03/21
                                     (05/09/20)
Anatomy (Fetus A)

 Cranium:               Previously seen        LVOT:                   Previously seen
 Cavum:                 Previously seen        Aortic Arch:            Previously seen
 Ventricles:            Appears normal         Ductal Arch:            Previously seen
 Choroid Plexus:        Choroid plexus         Diaphragm:              Appears normal
                        cyst previous
 Cerebellum:            Previously seen        Stomach:                Appears normal, left
                                                                       sided
 Posterior Fossa:       Previously seen        Abdomen:                Echogenic Foci
                                                                       Superior Stomach
 Nuchal Fold:           Not applicable (>20    Abdominal Wall:         Previously seen
                        wks GA)
 Face:                  Orbits and profile     Cord Vessels:           Previously seen
                        previously seen
 Lips:                  Previously seen        Kidneys:                Appear normal
 Palate:                Not well visualized    Bladder:                Appears normal
 Thoracic:              Previously seen        Spine:                  Previously seen
 Heart:                 Previously seen        Upper Extremities:      RUE prev. vis; LUE
                                                                       not well vis.
 RVOT:                  Previously seen        Lower Extremities:      Previously seen

 Other:  Female gender previously seen. Lenses, Nasal bone, Feet, 3VV and
         3VT visualized previously. Left radius/ulnar not visualized. Technically
         difficult due to fetal position.

Fetal Evaluation (Fetus B)

 Num Of Fetuses:         2
 Fetal Heart Rate(bpm):  147
 Cardiac Activity:       Observed
 Fetal Lie:              Upper Fetus
 Presentation:           Variable
 Placenta:               Posterior
 P. Cord Insertion:      Previously Visualized
 Membrane Desc:      Dividing Membrane seen - Monochorionic

 Amniotic Fluid
 AFI FV:      Within normal limits

                             Largest Pocket(cm)

Biometry (Fetus B)

 LV:        2.6  mm
Gestational Age (Fetus B)

 LMP:           25w 0d        Date:  03/28/20                 EDD:   01/02/21
 Best:          24w 6d     Det. By:  Early Ultrasound         EDD:   01/03/21
                                     (05/09/20)
Anatomy (Fetus B)

 Cranium:               Previously seen        LVOT:                   Previously seen
 Cavum:                 Previously seen        Aortic Arch:            Previously seen
 Ventricles:            Appears normal         Ductal Arch:            Previously seen
 Choroid Plexus:        Previously seen        Diaphragm:              Appears normal
 Cerebellum:            Previously seen        Stomach:                Appears normal, left
                                                                       sided
 Posterior Fossa:       Previously seen        Abdomen:                Echogenic Focus
                                                                       sup stomach
 Nuchal Fold:           Not applicable (>20    Abdominal Wall:         Previously seen
                        wks GA)
 Face:                  Orbits and profile     Cord Vessels:           Previously seen
                        previously seen
 Lips:                  Previously seen        Kidneys:                Appear normal
 Palate:                Not well visualized    Bladder:                Appears normal
 Thoracic:              Previously seen        Spine:                  Previously seen
 Heart:                 Previously seen        Upper Extremities:      Previously seen
 RVOT:                  Previously seen        Lower Extremities:      Previously seen

 Other:  Female gender previously seen. Nasal bone, Lenses, Feet, and 3VV
         visualized previously. Technically difficult due to fetal position.
Cervix Uterus Adnexa

 Cervix
 Normal appearance by transabdominal scan.

 Uterus
 No abnormality visualized.

 Right Ovary
 Within normal limits.

 Left Ovary
 Within normal limits.

 Cul De Sac
 No free fluid seen.

 Adnexa
 No abnormality visualized.
Comments

 This patient was seen for a limited ultrasound due to a
 monochorionic, diamniotic twin gestation.  She denies any
 problems since her last exam.

 There were no signs of the twin to twin transfusion syndrome
 noted today.  There was normal amniotic fluid noted around
 both fetuses.
 She will return in 2 weeks for a growth ultrasound.

## 2023-05-18 IMAGING — US US MFM OB FOLLOW-UP EACH ADDL GEST (MODIFY)
1 series · 16 of 28 positions shown · non-contrast
Comparison: none

[Series 1: us mfm ob follow-up each addl gest (modify) · 110 acquisitions, 16 frames shown]
[im 1/110]
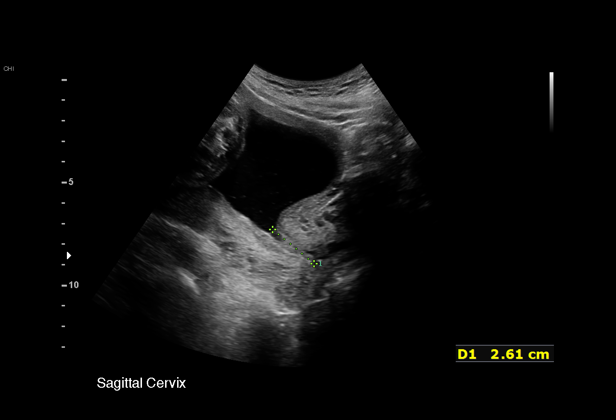
[im 9/110]
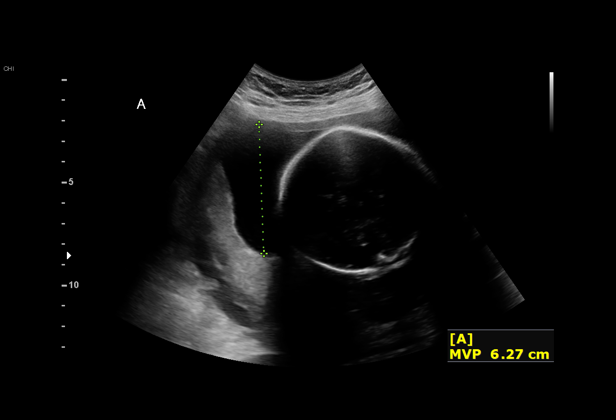
[im 17/110]
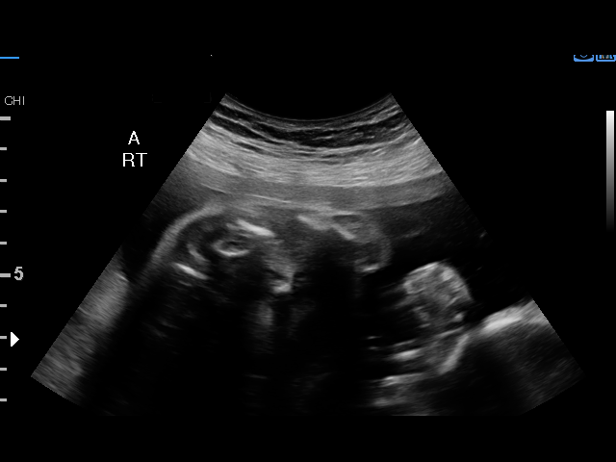
[im 25/110]
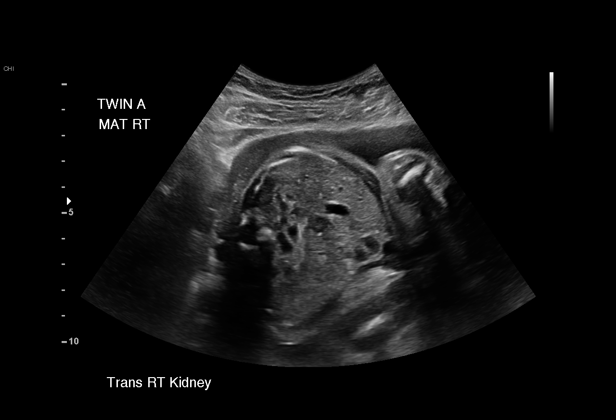
[im 29/110]
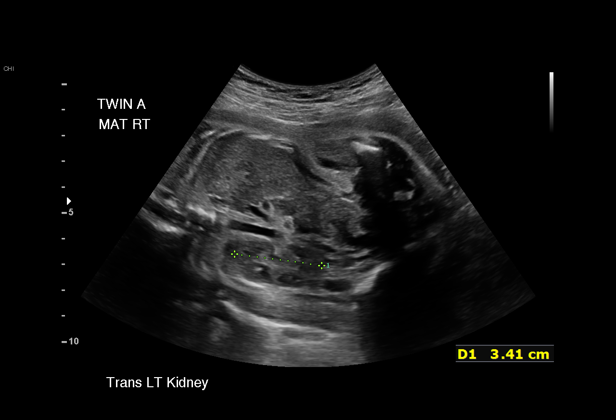
[im 37/110]
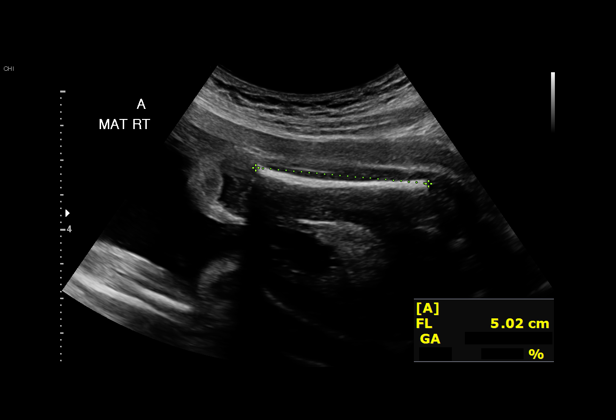
[im 45/110]
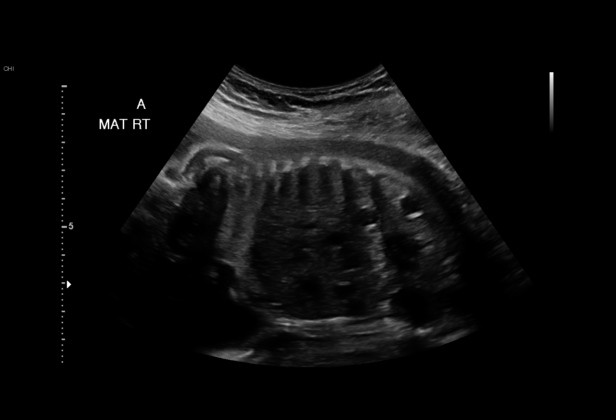
[im 53/110]
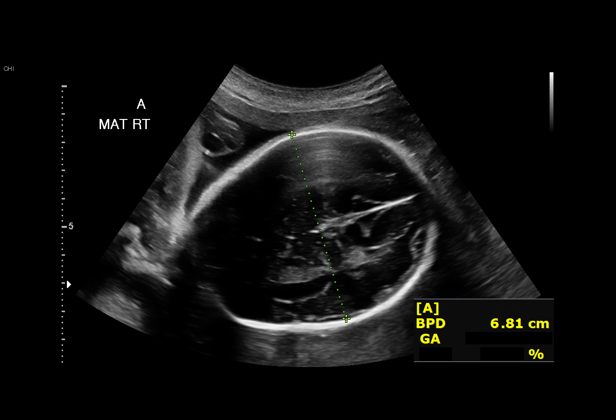
[im 57/110]
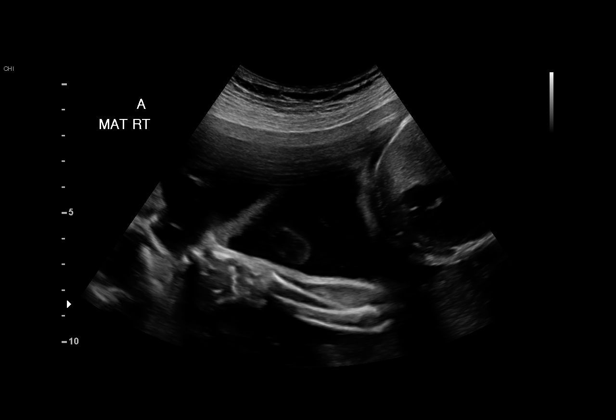
[im 65/110]
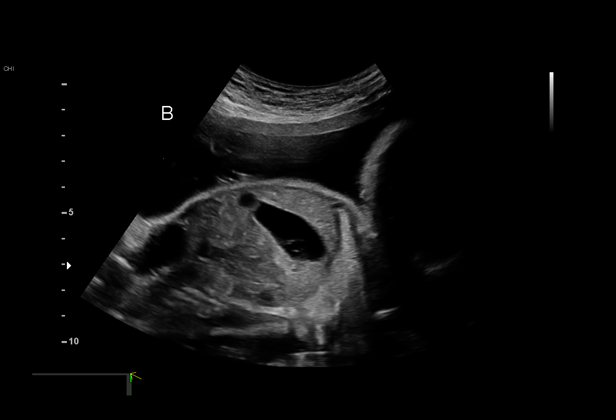
[im 73/110]
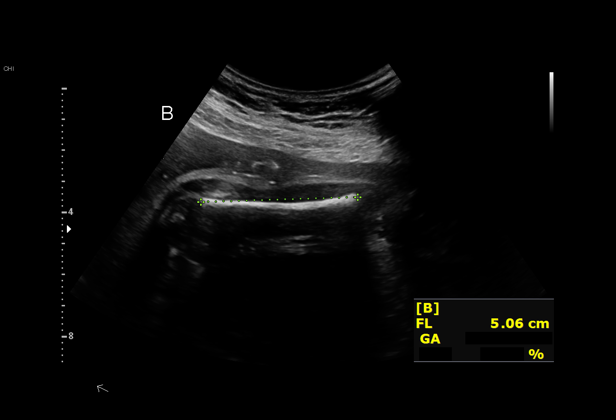
[im 81/110]
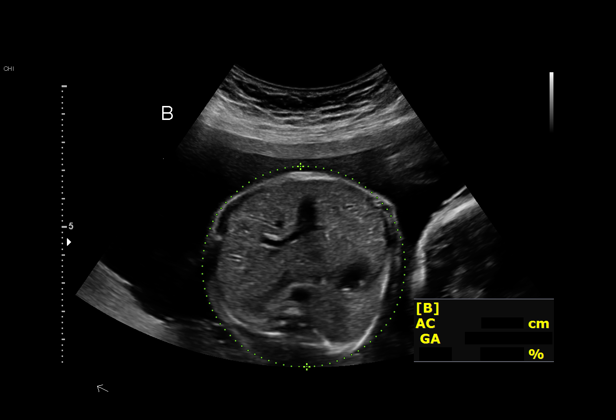
[im 85/110]
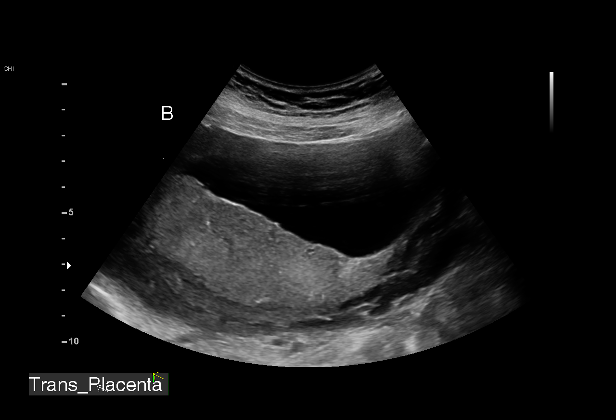
[im 93/110]
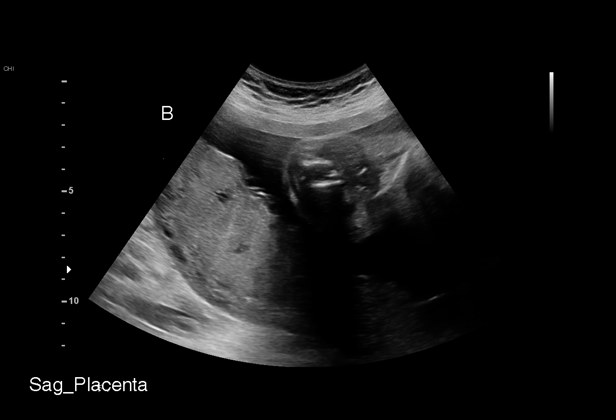
[im 101/110]
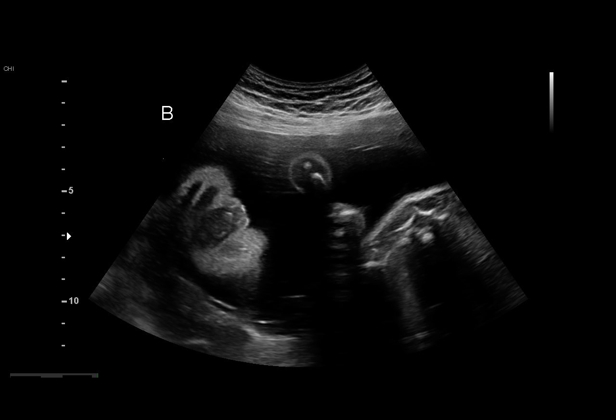
[im 110/110]
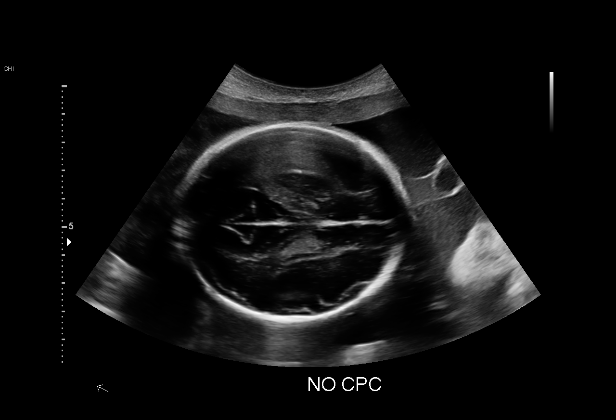

[16 of 28 positions shown; findings below may reference images not displayed]

OB/GYN

    GEST

Indications

 Twin pregnancy, Sanoshi/Evsin, second trimester
 Advanced maternal age multigravida 40,
 second trimester
 Negative AFP
 Fetal choroid plexus cyst (Twin A)
 27 weeks gestation of pregnancy
Fetal Evaluation (Fetus A)

 Num Of Fetuses:         2
 Fetal Heart Rate(bpm):  160
 Cardiac Activity:       Observed
 Fetal Lie:              Lower Fetus; Maternal right
 Presentation:           Breech
 Placenta:               Posterior
 P. Cord Insertion:      Previously Visualized
 Membrane Desc:      Dividing Membrane seen - Monochorionic

 Amniotic Fluid
 AFI FV:      Within normal limits

                             Largest Pocket(cm)

Biometry (Fetus A)

 BPD:      68.3  mm     G. Age:  27w 3d         35  %    CI:        72.76   %    70 - 86
                                                         FL/HC:      19.8   %    18.8 -
 HC:      254.6  mm     G. Age:  27w 5d         23  %    HC/AC:      1.09        1.05 -
 AC:      233.8  mm     G. Age:  27w 5d         46  %    FL/BPD:     73.9   %    71 - 87
 FL:       50.5  mm     G. Age:  27w 1d         22  %    FL/AC:      21.6   %    20 - 24
 LV:        3.3  mm

 Est. FW:    4912  gm      2 lb 6 oz     34  %     FW Discordancy      0 \ 3 %
OB History

 Gravidity:    5         Term:   2        Prem:   0        SAB:   1
 TOP:          1       Ectopic:  0        Living: 2
Gestational Age (Fetus A)

 LMP:           27w 5d        Date:  03/28/20                 EDD:   01/02/21
 U/S Today:     27w 4d                                        EDD:   01/03/21
 Best:          27w 4d     Det. By:  Early Ultrasound         EDD:   01/03/21
                                     (05/09/20)
Anatomy (Fetus A)

 Cranium:               Appears normal         Aortic Arch:            Previously seen
 Cavum:                 Appears normal         Ductal Arch:            Previously seen
 Ventricles:            Appears normal         Diaphragm:              Appears normal
 Choroid Plexus:        Choroid plexus         Stomach:                Appears normal, left
                        cyst previous
                                                                       sided
 Cerebellum:            Previously seen        Abdomen:                Echogenic Foci
                                                                       Superior Stomach
 Posterior Fossa:       Previously seen        Abdominal Wall:         Previously seen
 Face:                  Orbits and profile     Cord Vessels:           Previously seen
                        previously seen
 Lips:                  Previously seen        Kidneys:                Appear normal
 Thoracic:              Previously seen        Bladder:                Appears normal
 Heart:                 Appears normal         Spine:                  Previously seen
                        (4CH, axis, and
                        situs)
 RVOT:                  Previously seen        Upper Extremities:      RUE prev. vis; LUE
                                                                       not well vis.
 LVOT:                  Previously seen        Lower Extremities:      Previously seen

 Other:  Female gender previously seen. Lenses, Nasal bone, Feet, 3VV and
         3VT visualized previously. Left radius/ulnar not visualized. Technically
         difficult due to fetal position.

Fetal Evaluation (Fetus B)

 Num Of Fetuses:         2
 Fetal Heart Rate(bpm):  147
 Cardiac Activity:       Observed
 Fetal Lie:              Upper Fetus
 Presentation:           Transverse, head to maternal left
 Placenta:               Posterior
 P. Cord Insertion:      Previously Visualized
 Membrane Desc:      Dividing Membrane seen - Monochorionic
 Amniotic Fluid
 AFI FV:      Within normal limits

                             Largest Pocket(cm)

Biometry (Fetus B)

 BPD:      67.9  mm     G. Age:  27w 2d         30  %    CI:        71.36   %    70 - 86
                                                         FL/HC:      19.9   %    18.8 -
 HC:       256   mm     G. Age:  27w 6d         28  %    HC/AC:      1.13        1.05 -
 AC:      227.4  mm     G. Age:  27w 1d         28  %    FL/BPD:     75.0   %    71 - 87
 FL:       50.9  mm     G. Age:  27w 2d         26  %    FL/AC:      22.4   %    20 - 24
 LV:        3.5  mm

 Est. FW:    2148  gm      2 lb 5 oz     26  %     FW Discordancy         3  %
Gestational Age (Fetus B)

 LMP:           27w 5d        Date:  03/28/20                 EDD:   01/02/21
 U/S Today:     27w 3d                                        EDD:   01/04/21
 Best:          27w 4d     Det. By:  Early Ultrasound         EDD:   01/03/21
                                     (05/09/20)
Anatomy (Fetus B)

 Cranium:               Appears normal         Aortic Arch:            Previously seen
 Cavum:                 Appears normal         Ductal Arch:            Previously seen
 Ventricles:            Appears normal         Diaphragm:              Appears normal
 Choroid Plexus:        Previously seen        Stomach:                Appears normal, left
                                                                       sided
 Cerebellum:            Previously seen        Abdomen:                Echogenic Focus
                                                                       sup stomach
 Posterior Fossa:       Previously seen        Abdominal Wall:         Previously seen
 Nuchal Fold:           Not applicable (>20    Cord Vessels:           Previously seen
                        wks GA)
 Face:                  Orbits and profile     Kidneys:                Appear normal
                        previously seen
 Lips:                  Previously seen        Bladder:                Appears normal
 Thoracic:              Appears normal         Spine:                  Previously seen
 Heart:                 Appears normal         Upper Extremities:      Previously seen
                        (4CH, axis, and
                        situs)
 RVOT:                  Previously seen        Lower Extremities:      Previously seen
 LVOT:                  Previously seen

 Other:  Female gender previously seen. Nasal bone, Lenses, Feet, and 3VV
         visualized previously. Technically difficult due to fetal position.
Cervix Uterus Adnexa

 Cervix
 Length:           3.23  cm.
 Normal appearance by transabdominal scan.
Impression

 Monochorionic-diamniotic twin pregnancy.
 Twin A: Lower fetus, maternal right, breech presentation,
 posterior placenta.  Fetal growth is appropriate for gestational
 age.  Amniotic fluid is normal and good fetal activity seen.
 Fetal bladder is seen well.  An echogenic focus in the
 stomach is seen again.  Choroid plexus cyst has resolved.
 Twin B: Upper fetus, transverse lie and head to maternal left,
 posterior placenta.  Fetal growth is appropriate for gestational
 age. Amniotic fluid is normal and good fetal activity seen.
 Fetal bladder is seen well.  An echogenic focus in the
 stomach is seen again.
 Growth discordancy: 3% (normal).
 No evidence of twin-to-twin transfusion syndrome (TTTS).
 I reassured the patient that echogenic focus in the stomach is
 not associated with any adverse outcomes.
Recommendations

 -Follow-up in 2 weeks (TTTS surveillance).
                 Tiger, M Hamza

## 2023-07-13 IMAGING — US US MFM FETAL BPP W/O NON-STRESS
1 series · 14 of 28 positions shown · non-contrast
Comparison: none

[Series 1: us mfm fetal bpp w/o non-stress · 78 acquisitions, 14 frames shown]
[im 3/78]
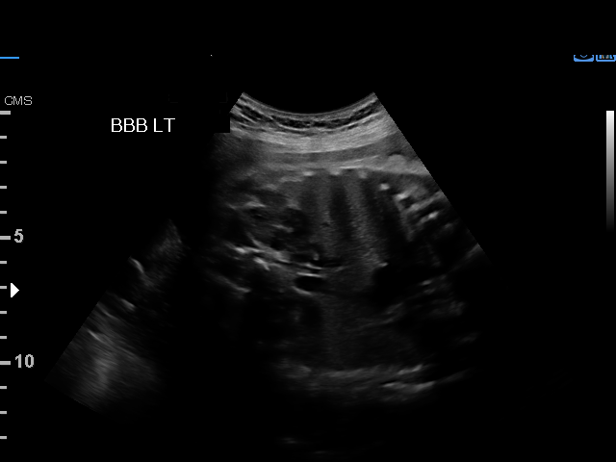
[im 9/78]
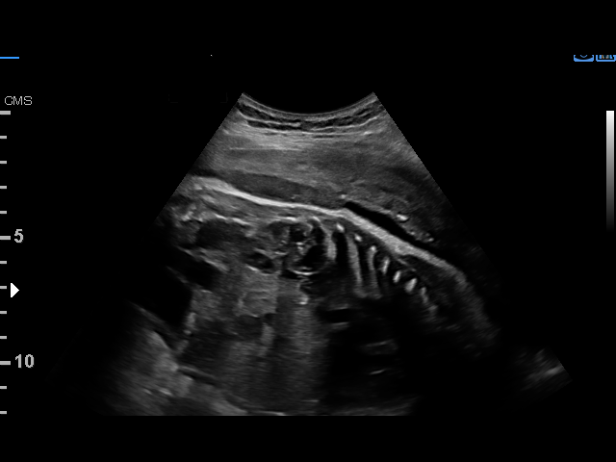
[im 15/78]
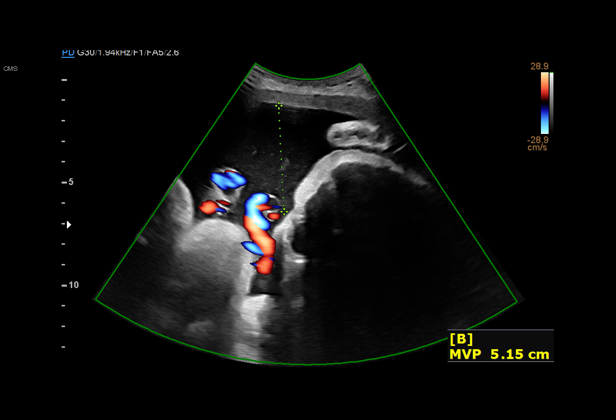
[im 20/78]
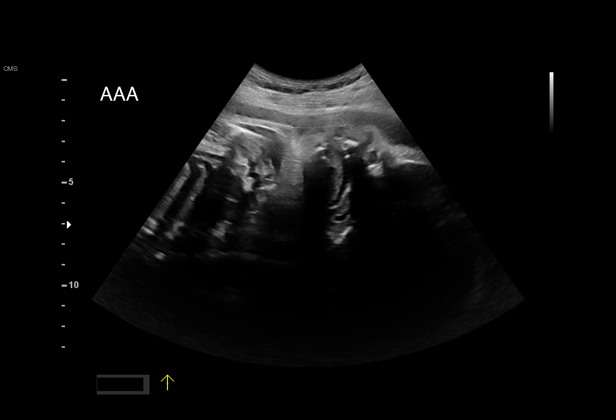
[im 26/78]
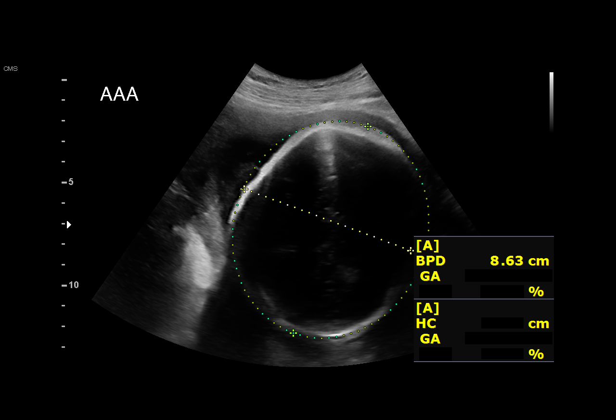
[im 32/78]
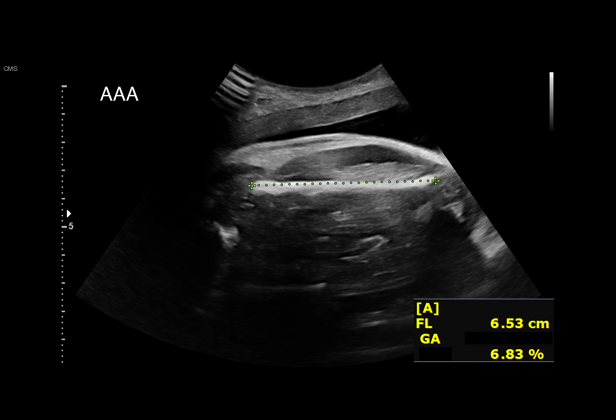
[im 38/78]
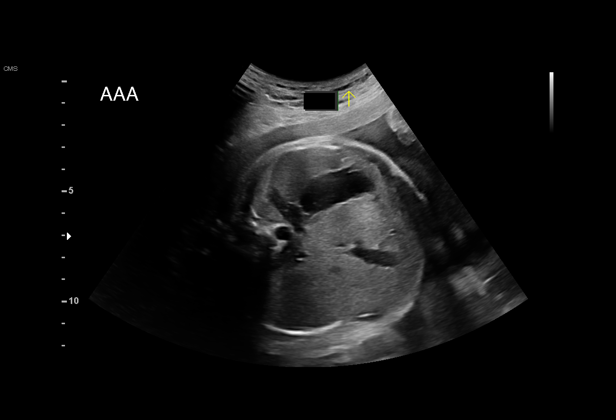
[im 43/78]
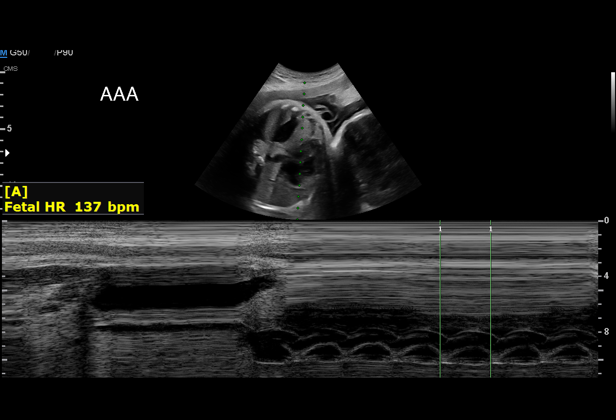
[im 49/78]
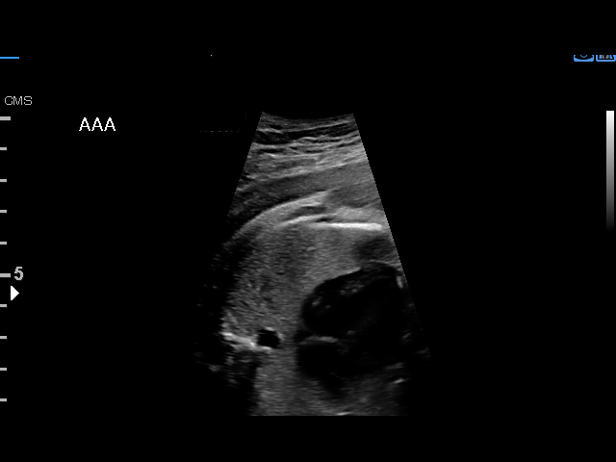
[im 55/78]
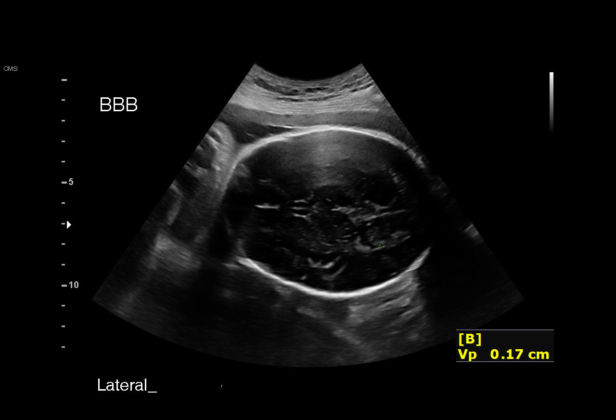
[im 60/78]
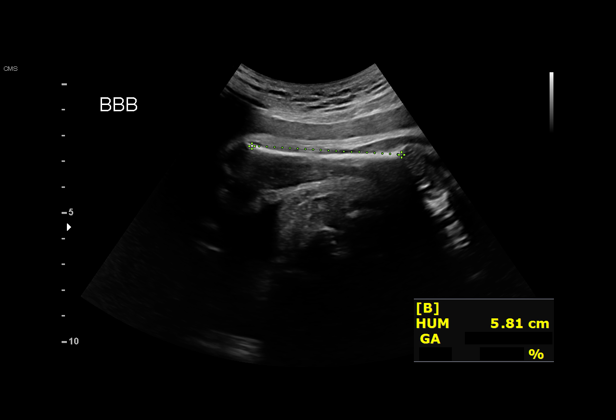
[im 66/78]
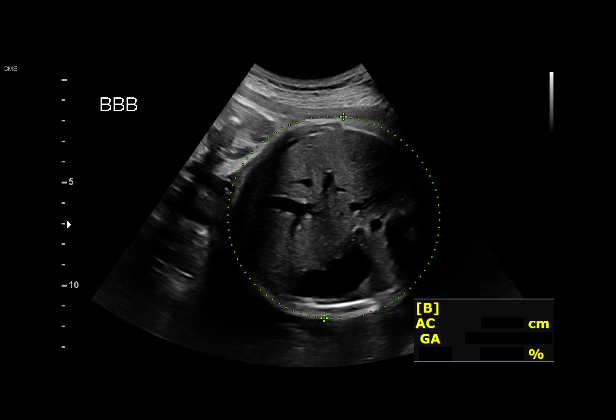
[im 72/78]
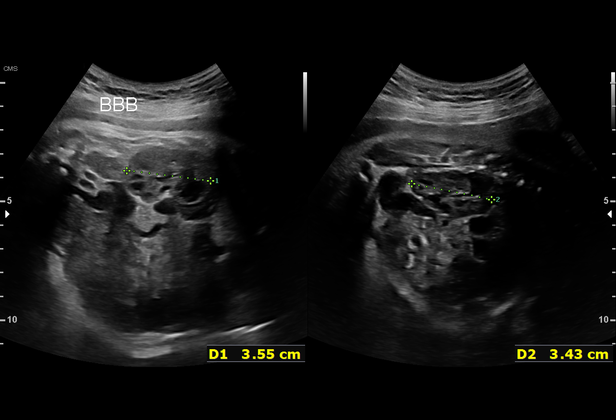
[im 78/78]
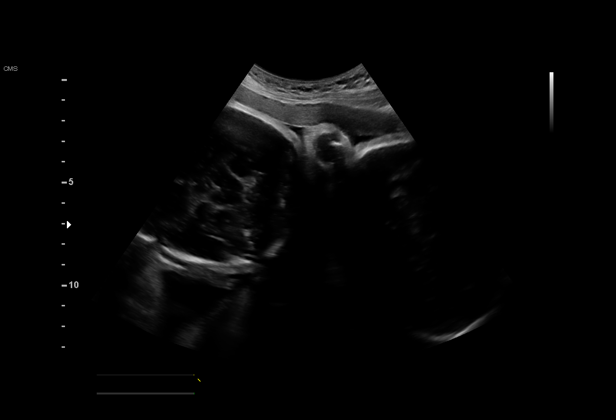

[14 of 28 positions shown; findings below may reference images not displayed]

OB/GYN

    ADDL GESTATION
    GEST

Indications

 Twin pregnancy, [place]/Kieffer, third trimester
 Gestational hypertension without significant
 proteinuria, third trimester
 Advanced maternal age multigravida 35+,
 third trimester (40 yrs)
 35 weeks gestation of pregnancy
 Low risk NIPS,Negative AFP
 Normal Fetal ECHO x 2
Fetal Evaluation (Fetus A)

 Num Of Fetuses:         2
 Fetal Heart Rate(bpm):  137
 Cardiac Activity:       Observed
 Fetal Lie:              Lower Right Fetus
 Presentation:           Cephalic
 Placenta:               Posterior
 P. Cord Insertion:      Previously Visualized
 Membrane Desc:      Dividing Membrane seen - Monochorionic

 Amniotic Fluid
 AFI FV:      Within normal limits

                             Largest Pocket(cm)

Biophysical Evaluation (Fetus A)

 Amniotic F.V:   Within normal limits       F. Tone:        Observed
 F. Movement:    Observed                   Score:          [DATE]
 F. Breathing:   Observed
Biometry (Fetus A)

 BPD:      86.7  mm     G. Age:  35w 0d         38  %    CI:        75.21   %    70 - 86
                                                         FL/HC:      20.7   %    20.1 -
 HC:      317.1  mm     G. Age:  35w 5d         20  %    HC/AC:      1.02        0.93 -
 AC:      311.1  mm     G. Age:  35w 0d         43  %    FL/BPD:     75.8   %    71 - 87
 FL:       65.7  mm     G. Age:  33w 6d          9  %    FL/AC:      21.1   %    20 - 24
 HUM:      58.6  mm     G. Age:  33w 6d         35  %

 Est. FW:    1148  gm      5 lb 9 oz     28  %     FW Discordancy      0 \ 3 %
OB History

 Gravidity:    5         Term:   2        Prem:   0        SAB:   1
 TOP:          1       Ectopic:  0        Living: 2
Gestational Age (Fetus A)

 LMP:           35w 5d        Date:  03/28/20                 EDD:   01/02/21
 U/S Today:     34w 6d                                        EDD:   01/08/21
 Best:          35w 4d     Det. By:  Early Ultrasound         EDD:   01/03/21
                                     (05/09/20)
Anatomy (Fetus A)

 Cranium:               Appears normal         LVOT:                   Appears normal
 Cavum:                 Previously seen        Aortic Arch:            Previously seen
 Ventricles:            Previously seen        Ductal Arch:            Previously seen
 Choroid Plexus:        Previously seen        Diaphragm:              Appears normal
 Cerebellum:            Previously seen        Stomach:                Appears normal, left
                                                                       sided
 Posterior Fossa:       Previously seen        Abdomen:                Appears normal
 Nuchal Fold:           Not applicable (>20    Abdominal Wall:         Previously seen
                        wks GA)
 Face:                  Appears normal         Cord Vessels:           Previously seen
                        (orbits and profile)
 Lips:                  Previously seen        Kidneys:                Appear normal
 Palate:                Appears normal         Bladder:                Appears normal
 Thoracic:              Previously seen        Spine:                  Previously seen
 Heart:                 Appears normal         Upper Extremities:      Previously seen
                        (4CH, axis, and
                        situs)
 RVOT:                  Previously seen        Lower Extremities:      Previously seen

 Other:  Female gender previously seen. Lenses and feet previously
         visualized. Nasal bone, 3VV and 3VT previously visualized.
         Technically difficult due to fetal position.
Fetal Evaluation (Fetus B)

 Num Of Fetuses:         2
 Fetal Heart Rate(bpm):  143
 Cardiac Activity:       Observed
 Fetal Lie:              Upper Left Fetus
 Presentation:           Cephalic
 Placenta:               Posterior
 P. Cord Insertion:      Previously Visualized
 Membrane Desc:      Dividing Membrane seen - Monochorionic

 Amniotic Fluid
 AFI FV:      Within normal limits

                             Largest Pocket(cm)

Biophysical Evaluation (Fetus B)

 Amniotic F.V:   Within normal limits       F. Tone:        Observed
 F. Movement:    Observed                   Score:          [DATE]
 F. Breathing:   Observed
Biometry (Fetus B)

 BPD:      81.3  mm     G. Age:  32w 5d        1.8  %    CI:        72.66   %    70 - 86
                                                         FL/HC:      21.5   %    20.1 -
 HC:      303.3  mm     G. Age:  33w 5d        1.3  %    HC/AC:      0.96        0.93 -
 AC:      314.6  mm     G. Age:  35w 3d         53  %    FL/BPD:     80.1   %    71 - 87
 FL:       65.1  mm     G. Age:  33w 4d          6  %    FL/AC:      20.7   %    20 - 24
 HUM:      58.2  mm     G. Age:  33w 5d         31  %
 LV:        1.7  mm

 Est. FW:    9936  gm      5 lb 6 oz     20  %     FW Discordancy         3  %
Gestational Age (Fetus B)

 LMP:           35w 5d        Date:  03/28/20                 EDD:   01/02/21
 U/S Today:     33w 6d                                        EDD:   01/15/21
 Best:          35w 4d     Det. By:  Early Ultrasound         EDD:   01/03/21
                                     (05/09/20)
Anatomy (Fetus B)

 Cranium:               Appears normal         Aortic Arch:            Previously seen
 Cavum:                 Appears normal         Ductal Arch:            Previously seen
 Ventricles:            Appears normal         Diaphragm:              Appears normal
 Choroid Plexus:        Previously seen        Stomach:                Appears normal, left
                                                                       sided
 Cerebellum:            Previously seen        Abdomen:                Echogenic Focus
                                                                       sup stomach
 Posterior Fossa:       Previously seen        Abdominal Wall:         Previously seen
 Nuchal Fold:           Not applicable (>20    Cord Vessels:           Previously seen
                        wks GA)
 Face:                  Orbits and profile     Kidneys:                Appear normal
                        previously seen
 Lips:                  Appears normal         Bladder:                Appears normal
 Thoracic:              Appears normal         Spine:                  Previously seen
 Heart:                 Previously seen        Upper Extremities:      Previously seen
 RVOT:                  Previously seen        Lower Extremities:      Previously seen
 LVOT:                  Appears normal

 Other:  Female gender previously seen. Nasal bone, Lenses, Feet, and 3VV
         visualized previously. Technically difficult due to fetal position.
Impression

 Monochorionic-diamniotic twin pregnancy.  Patient returned
 for fetal growth assessment and BPP.
 She has gestational hypertension.  Blood pressure today at
 her office is 152/99 mmHg and repeat blood pressure was
 143/99 mmHg.  She does not have symptoms and signs of
 severe features of preeclampsia.
 Twin A: Lower fetus, maternal right, cephalic presentation,
 posterior placenta.  Fetal growth is appropriate for gestational
 age.  Amniotic fluid is normal good fetal activity seen.  Fetal
 bladder is seen well.  Antenatal testing is reassuring.  BPP
 [DATE].  Cephalic presentation.
 Twin A is a presenting fetus.
 Twin B: Upper fetus, maternal left, cephalic presentation,
 posterior placenta. Fetal growth is appropriate for gestational
 age.  Amniotic fluid is normal good fetal activity seen.  Fetal
 bladder is seen well.  Antenatal testing is reassuring.  BPP
 [DATE].  Cephalic presentation.

 Growth discordancy: 3% (normal).
 I discussed the signs and symptoms of severe features of
 preeclampsia.  Given that she has gestational hypertension, I
 recommend delivery at 36 weeks gestation (twin pregnancy).
 Patient reports that she will buy a blood pressure cuff to
 check her blood pressures.  I educated her on the
 parameters.
 I will be communicating with Dr. Jaya.
Recommendations

 -Delivery at 36 weeks (or 36+ weeks).
                 Mirtill, Plutzer

## 2023-11-16 ENCOUNTER — Encounter: Payer: Self-pay | Admitting: *Deleted

## 2023-11-17 ENCOUNTER — Ambulatory Visit (HOSPITAL_BASED_OUTPATIENT_CLINIC_OR_DEPARTMENT_OTHER): Admitting: Cardiovascular Disease

## 2023-11-17 ENCOUNTER — Encounter (HOSPITAL_BASED_OUTPATIENT_CLINIC_OR_DEPARTMENT_OTHER): Payer: Self-pay | Admitting: Cardiovascular Disease

## 2023-11-17 VITALS — BP 136/86 | HR 80 | Ht 66.0 in | Wt 216.0 lb

## 2023-11-17 DIAGNOSIS — I10 Essential (primary) hypertension: Secondary | ICD-10-CM

## 2023-11-17 DIAGNOSIS — Z6834 Body mass index (BMI) 34.0-34.9, adult: Secondary | ICD-10-CM | POA: Diagnosis not present

## 2023-11-17 NOTE — Progress Notes (Signed)
 "  Advanced Hypertension Clinic Initial Assessment:    Date:  11/17/2023   ID:  Nina Nina Rodriguez, Nina Rodriguez 10-26-1980, MRN [id number]  PCP:  Pcp, No  Cardiologist:  None   Referring MD: Rutherford Gain, MD   CC: Hypertension  History of Present Illness:    Nina Nina Rodriguez is a 43 y.o. female with a hx of hypertension here to establish care in the [facility]pert[facility].  [name] saw Dr. Rutherford 08/2023 and blood pressure was 162/117.  She was taking nifedipine .  She was referred to the advanced hypertension clinic.  She has a history of gestational hypertension.  Discussed the use of AI scribe software for clinical note transcription with the patient, who gave verbal consent to proceed.  History of Present Illness [name] notes that her blood pressure has been elevated, with readings typically around 150-160/100 mmHg. She does not check her blood pressure daily but is currently on nifedipine  30 mg daily. She reports that her blood pressure issues began during her pregnancy with twins in 2022, and she attributes her current high blood pressure to stress, lack of exercise, and poor sleep due to caring for her young children.  She has a family history of cardiovascular issues, including a half-brother with heart problems and blood clots, and a mother who had a stroke in her late fifties to early sixties. Her father has had hypertension in the past but is not currently on medication. Diabetes is prevalent on her father's side of the family.  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 She also experiences numbness in her legs, which she associates with sleeping on uncomfortable beds. No chest pain or pressure is reported, but she occasionally experiences floaters in her vision.  Her lifestyle is currently sedentary due to the demands of caring for her children, but she plans to incorporate  exercise into her routine once her children start daycare. She does not consume alcohol or caffeine regularly. She tries to cook at home and watch her salt intake, although she sometimes resorts to convenience foods due to her busy schedule. She does not take any over-the-counter supplements or herbs.   Past Medical History:  Diagnosis Date   Active labor (plan water labor/birth) 03/07/2011   Bertrum syndrome    Medical history non-contributory    Normal breast feeding    Normal labor 03/31/2013   Normal labor 03/31/2013   Postpartum care following vaginal delivery (12/4) 03/31/2013   Postpartum care following vaginal delivery (water birth 11/9/) 03/07/2011    Past Surgical History:  Procedure Laterality Date   DILATION AND EVACUATION     MOUTH SURGERY     age 14    Current Medications: Current Meds  Medication Sig   ibuprofen  (ADVIL ) 600 MG tablet Take 1 tablet (600 mg total) by mouth every 6 (six) hours.   labetalol  (NORMODYNE ) 100 MG tablet Take 1 tablet (100 mg total) by mouth 2 (two) times daily.   norethindrone (MICRONOR) 0.35 MG tablet Take 1 tablet by mouth daily.     Allergies:   Patient has no known allergies.   Social History   Socioeconomic History   Marital status: Married    Spouse name: Prentice   Number of children: Not on file   Years of education: Not on file   Highest education level: Not on file  Occupational History   Not on file  Tobacco Use  Smoking status: Never   Smokeless tobacco: Never  Vaping Use   Vaping status: Never Used  Substance and Sexual Activity   Alcohol use: No   Drug use: No   Sexual activity: Yes    Birth control/protection: None  Other Topics Concern   Not on file  Social History Narrative   Not on file   Social Drivers of Health   Financial Resource Strain: Not on file  Food Insecurity: Not on file  Transportation Needs: Not on file  Physical Activity: Not on file  Stress: Not on file  Social Connections: Not on  file     Family History: The patient's family history includes Diabetes in her paternal grandfather, paternal grandmother, paternal uncle, and sister; Hypertension in her father, maternal grandfather, maternal grandmother, mother, paternal grandfather, paternal grandmother, and sister.  ROS:   Please see the history of present illness.     All other systems reviewed and are negative.  EKGs/Labs/Other Studies Reviewed:    EKG:  EKG is ordered today.    EKG Interpretation Date/Time:  Tuesday November 17 2023 15:03:09 EDT Ventricular Rate:  80 PR Interval:  170 QRS Duration:  76 QT Interval:  380 QTC Calculation: 438 R Axis:   68  Text Interpretation: Normal sinus rhythm Nonspecific T wave abnormality No previous ECGs available Confirmed by Raford Riggs ([id number]) on 11/17/2023 3:10:48 PM         Recent Labs: No results found for requested labs within last 365 days.   Recent Lipid Panel No results found for: CHOL, TRIG, HDL, CHOLHDL, VLDL, LDLCALC, LDLDIRECT  Physical Exam:   VS:  BP 136/86   Pulse 80   Ht 5' 6 (1.676 m)   Wt 216 lb (98 kg)   SpO2 99%   BMI 34.86 kg/m  , BMI Body mass index is 34.86 kg/m. GENERAL:  Well appearing HEENT: Pupils equal round and reactive, fundi not visualized, oral mucosa unremarkable NECK:  No jugular venous distention, waveform within normal limits, carotid upstroke brisk and symmetric, no bruits, no thyromegaly LUNGS:  Clear to auscultation bilaterally HEART:  RRR.  PMI not displaced or sustained,S1 and S2 within normal limits, no S3, no S4, no clicks, no rubs, no murmurs ABD:  Flat, positive bowel sounds normal in frequency in pitch, no bruits, no rebound, no guarding, no midline pulsatile mass, no hepatomegaly, no splenomegaly EXT:  2 plus pulses throughout, no edema, no cyanosis no clubbing SKIN:  No rashes no nodules NEURO:  Cranial nerves II through XII grossly intact, motor grossly intact throughout PSYCH:   Cognitively intact, oriented to person place and time   ASSESSMENT/PLAN:    Assessment & Plan # Hypertension Chronic hypertension, likely exacerbated by stress, lack of exercise, and dietary habits. Blood pressure readings elevated, currently on nifedipine  30 mg. Blood pressure today 132/86 mmHg, indicating some control. - Continue nifedipine  30 mg daily. - Encourage regular home blood pressure monitoring. - Advise lifestyle modifications: increase physical activity, reduce dietary salt, manage stress. - Recommend 20-pound weight loss to potentially reduce medication need. - Discuss dietary changes: reduce fast food, increase home-cooked meals, monitor salt intake. - Schedule fasting labs for cholesterol, A1c, thyroid function. - Refer to primary care physician for ongoing hypertension management.  # CV Prevention: She will come for fasting lipids/CMP and A1c.    Screening for Secondary Hypertension:     Relevant Labs/Studies:    Latest Ref Rng & Units 12/05/2020    5:45 AM 12/04/2020   10:35  AM 02/28/2008    4:24 PM  Basic Labs  Sodium 135 - 145 mmol/L 133  134  139   Potassium 3.5 - 5.1 mmol/L 3.2  3.6  4.0   Creatinine 0.44 - 1.00 mg/dL 9.39  9.41  9.31      Disposition:    FU with MD/PharmD in 4 months    Medication Adjustments/Labs and Tests Ordered: Current medicines are reviewed at length with the patient today.  Concerns regarding medicines are outlined above.  Orders Placed This Encounter  Procedures   EKG 12-Lead   No orders of the defined types were placed in this encounter.    Signed, Annabella Scarce, MD  11/17/2023 3:10 PM    [facility]  "

## 2023-11-17 NOTE — Patient Instructions (Addendum)
 Medication Instructions:  Your physician recommends that you continue on your current medications as directed. Please refer to the Current Medication list given to you today.   Labwork: FASTING LP/CMET/A1C SOON   Testing/Procedures: NONE  Follow-Up: 4 MONTHS WITH DR Eddy OR CAITLIN W NP   Any Other Special Instructions Will Be Listed Below (If Applicable). WORK ON TRYING TO FIND 30 MINUTES FOR YOURSELF DAILY TO EXERCISE   Consider:  Dr. Tanda Bame Dr. Catheryn Slocumb Dr. Clotilda Single

## 2023-11-18 ENCOUNTER — Encounter (HOSPITAL_BASED_OUTPATIENT_CLINIC_OR_DEPARTMENT_OTHER): Payer: Self-pay | Admitting: Cardiovascular Disease

## 2024-03-10 ENCOUNTER — Encounter (HOSPITAL_BASED_OUTPATIENT_CLINIC_OR_DEPARTMENT_OTHER): Admitting: Family

## 2024-04-22 ENCOUNTER — Encounter: Payer: Self-pay | Admitting: Family

## 2024-04-22 ENCOUNTER — Encounter (HOSPITAL_BASED_OUTPATIENT_CLINIC_OR_DEPARTMENT_OTHER): Payer: Self-pay | Admitting: Family

## 2024-05-06 ENCOUNTER — Encounter (HOSPITAL_BASED_OUTPATIENT_CLINIC_OR_DEPARTMENT_OTHER): Admitting: Family
